# Patient Record
Sex: Female | Born: 1950 | Race: White | Hispanic: No
Health system: Southern US, Community
[De-identification: ages and names within clinical notes are randomized; demographics above are authoritative.]

## PROBLEM LIST (undated history)

## (undated) DIAGNOSIS — J9621 Acute and chronic respiratory failure with hypoxia: Secondary | ICD-10-CM

## (undated) DIAGNOSIS — U071 COVID-19: Secondary | ICD-10-CM

## (undated) DIAGNOSIS — G6281 Critical illness polyneuropathy: Secondary | ICD-10-CM

## (undated) DIAGNOSIS — J189 Pneumonia, unspecified organism: Secondary | ICD-10-CM

## (undated) DIAGNOSIS — G7000 Myasthenia gravis without (acute) exacerbation: Secondary | ICD-10-CM

## (undated) DIAGNOSIS — J1282 Pneumonia due to coronavirus disease 2019: Secondary | ICD-10-CM

---

## 2019-12-10 ENCOUNTER — Inpatient Hospital Stay: Admission: RE | Admit: 2019-12-10 | Discharge: 2020-01-17 | Payer: Medicare Other | Source: Ambulatory Visit

## 2019-12-10 ENCOUNTER — Other Ambulatory Visit (HOSPITAL_COMMUNITY): Payer: Medicare Other

## 2019-12-10 DIAGNOSIS — Z9911 Dependence on respirator [ventilator] status: Secondary | ICD-10-CM

## 2019-12-10 DIAGNOSIS — R0902 Hypoxemia: Secondary | ICD-10-CM

## 2019-12-10 DIAGNOSIS — G6281 Critical illness polyneuropathy: Secondary | ICD-10-CM | POA: Diagnosis present

## 2019-12-10 DIAGNOSIS — J9621 Acute and chronic respiratory failure with hypoxia: Secondary | ICD-10-CM | POA: Diagnosis present

## 2019-12-10 DIAGNOSIS — J1282 Pneumonia due to coronavirus disease 2019: Secondary | ICD-10-CM | POA: Diagnosis present

## 2019-12-10 DIAGNOSIS — U071 COVID-19: Secondary | ICD-10-CM | POA: Diagnosis present

## 2019-12-10 DIAGNOSIS — G7000 Myasthenia gravis without (acute) exacerbation: Secondary | ICD-10-CM | POA: Diagnosis present

## 2019-12-10 DIAGNOSIS — R0603 Acute respiratory distress: Secondary | ICD-10-CM

## 2019-12-10 DIAGNOSIS — J189 Pneumonia, unspecified organism: Secondary | ICD-10-CM | POA: Diagnosis present

## 2019-12-10 DIAGNOSIS — K567 Ileus, unspecified: Secondary | ICD-10-CM

## 2019-12-10 DIAGNOSIS — J9690 Respiratory failure, unspecified, unspecified whether with hypoxia or hypercapnia: Secondary | ICD-10-CM

## 2019-12-10 DIAGNOSIS — Z431 Encounter for attention to gastrostomy: Secondary | ICD-10-CM

## 2019-12-10 HISTORY — DX: Critical illness polyneuropathy: G62.81

## 2019-12-10 HISTORY — DX: Pneumonia, unspecified organism: J18.9

## 2019-12-10 HISTORY — DX: COVID-19: U07.1

## 2019-12-10 HISTORY — DX: Myasthenia gravis without (acute) exacerbation: G70.00

## 2019-12-10 HISTORY — DX: Pneumonia due to coronavirus disease 2019: J12.82

## 2019-12-10 HISTORY — DX: Acute and chronic respiratory failure with hypoxia: J96.21

## 2019-12-10 LAB — BLOOD GAS, ARTERIAL
Acid-Base Excess: 11.9 mmol/L — ABNORMAL HIGH (ref 0.0–2.0)
Bicarbonate: 36.7 mmol/L — ABNORMAL HIGH (ref 20.0–28.0)
FIO2: 40
O2 Saturation: 97.2 %
Patient temperature: 37
pCO2 arterial: 55.5 mmHg — ABNORMAL HIGH (ref 32.0–48.0)
pH, Arterial: 7.435 (ref 7.350–7.450)
pO2, Arterial: 87 mmHg (ref 83.0–108.0)

## 2019-12-11 ENCOUNTER — Other Ambulatory Visit (HOSPITAL_COMMUNITY): Payer: Medicare Other

## 2019-12-11 DIAGNOSIS — G6281 Critical illness polyneuropathy: Secondary | ICD-10-CM | POA: Diagnosis present

## 2019-12-11 DIAGNOSIS — U071 COVID-19: Secondary | ICD-10-CM | POA: Diagnosis present

## 2019-12-11 DIAGNOSIS — J189 Pneumonia, unspecified organism: Secondary | ICD-10-CM

## 2019-12-11 DIAGNOSIS — G7000 Myasthenia gravis without (acute) exacerbation: Secondary | ICD-10-CM | POA: Diagnosis present

## 2019-12-11 DIAGNOSIS — J9621 Acute and chronic respiratory failure with hypoxia: Secondary | ICD-10-CM | POA: Diagnosis not present

## 2019-12-11 DIAGNOSIS — J1282 Pneumonia due to coronavirus disease 2019: Secondary | ICD-10-CM

## 2019-12-11 LAB — COMPREHENSIVE METABOLIC PANEL
ALT: 16 U/L (ref 0–44)
AST: 20 U/L (ref 15–41)
Albumin: 2.6 g/dL — ABNORMAL LOW (ref 3.5–5.0)
Alkaline Phosphatase: 97 U/L (ref 38–126)
Anion gap: 10 (ref 5–15)
BUN: 15 mg/dL (ref 8–23)
CO2: 36 mmol/L — ABNORMAL HIGH (ref 22–32)
Calcium: 9.2 mg/dL (ref 8.9–10.3)
Chloride: 91 mmol/L — ABNORMAL LOW (ref 98–111)
Creatinine, Ser: 0.49 mg/dL (ref 0.44–1.00)
GFR calc Af Amer: >60 mL/min (ref >60)
GFR calc non Af Amer: >60 mL/min (ref >60)
Glucose, Bld: 163 mg/dL — ABNORMAL HIGH (ref 70–99)
Potassium: 4.1 mmol/L (ref 3.5–5.1)
Sodium: 137 mmol/L (ref 135–145)
Total Bilirubin: 0.6 mg/dL (ref 0.3–1.2)
Total Protein: 6.2 g/dL — ABNORMAL LOW (ref 6.5–8.1)

## 2019-12-11 LAB — CBC WITH DIFFERENTIAL/PLATELET
Abs Immature Granulocytes: 0.07 10*3/uL (ref 0.00–0.07)
Basophils Absolute: 0 10*3/uL (ref 0.0–0.1)
Basophils Relative: 0 %
Eosinophils Absolute: 0.1 10*3/uL (ref 0.0–0.5)
Eosinophils Relative: 3 %
HCT: 27.7 % — ABNORMAL LOW (ref 36.0–46.0)
Hemoglobin: 8.2 g/dL — ABNORMAL LOW (ref 12.0–15.0)
Immature Granulocytes: 1 %
Lymphocytes Relative: 23 %
Lymphs Abs: 1.3 10*3/uL (ref 0.7–4.0)
MCH: 27.2 pg (ref 26.0–34.0)
MCHC: 29.6 g/dL — ABNORMAL LOW (ref 30.0–36.0)
MCV: 91.7 fL (ref 80.0–100.0)
Monocytes Absolute: 0.4 10*3/uL (ref 0.1–1.0)
Monocytes Relative: 8 %
Neutro Abs: 3.6 10*3/uL (ref 1.7–7.7)
Neutrophils Relative %: 65 %
Platelets: 250 10*3/uL (ref 150–400)
RBC: 3.02 MIL/uL — ABNORMAL LOW (ref 3.87–5.11)
RDW: 17.4 % — ABNORMAL HIGH (ref 11.5–15.5)
WBC: 5.5 10*3/uL (ref 4.0–10.5)
nRBC: 0 % (ref 0.0–0.2)

## 2019-12-11 LAB — PROTIME-INR
INR: 1.1 (ref 0.8–1.2)
Prothrombin Time: 13.6 seconds (ref 11.4–15.2)

## 2019-12-11 LAB — T4, FREE: Free T4: 1.15 ng/dL — ABNORMAL HIGH (ref 0.61–1.12)

## 2019-12-11 LAB — HEMOGLOBIN A1C
Hgb A1c MFr Bld: 6.4 % — ABNORMAL HIGH (ref 4.8–5.6)
Mean Plasma Glucose: 136.98 mg/dL

## 2019-12-11 LAB — PHOSPHORUS: Phosphorus: 3.5 mg/dL (ref 2.5–4.6)

## 2019-12-11 LAB — MAGNESIUM: Magnesium: 2 mg/dL (ref 1.7–2.4)

## 2019-12-11 LAB — TSH: TSH: 4.042 u[IU]/mL (ref 0.350–4.500)

## 2019-12-11 MED ORDER — GENERIC EXTERNAL MEDICATION
Status: DC
Start: ? — End: 2019-12-11

## 2019-12-11 MED ORDER — ASPIRIN 81 MG PO CHEW
81.00 | CHEWABLE_TABLET | ORAL | Status: DC
Start: 2019-12-11 — End: 2019-12-11

## 2019-12-11 MED ORDER — FENTANYL CITRATE (PF) 2500 MCG/50ML IJ SOLN
25.00 | INTRAMUSCULAR | Status: DC
Start: ? — End: 2019-12-11

## 2019-12-11 MED ORDER — ACETAMINOPHEN 650 MG RE SUPP
650.00 | RECTAL | Status: DC
Start: ? — End: 2019-12-11

## 2019-12-11 MED ORDER — ALBUTEROL SULFATE HFA 108 (90 BASE) MCG/ACT IN AERS
2.00 | INHALATION_SPRAY | RESPIRATORY_TRACT | Status: DC
Start: ? — End: 2019-12-11

## 2019-12-11 MED ORDER — INSULIN LISPRO 100 UNIT/ML ~~LOC~~ SOLN
1.00 | SUBCUTANEOUS | Status: DC
Start: 2019-12-10 — End: 2019-12-11

## 2019-12-11 MED ORDER — AMLODIPINE BESYLATE 10 MG PO TABS
10.00 | ORAL_TABLET | ORAL | Status: DC
Start: 2019-12-11 — End: 2019-12-11

## 2019-12-11 MED ORDER — HYDRALAZINE HCL 10 MG PO TABS
10.00 | ORAL_TABLET | ORAL | Status: DC
Start: 2019-12-10 — End: 2019-12-11

## 2019-12-11 MED ORDER — MIDAZOLAM HCL 2 MG/2ML IJ SOLN
2.00 | INTRAMUSCULAR | Status: DC
Start: ? — End: 2019-12-11

## 2019-12-11 MED ORDER — INSULIN LISPRO 100 UNIT/ML ~~LOC~~ SOLN
1.00 | SUBCUTANEOUS | Status: DC
Start: ? — End: 2019-12-11

## 2019-12-11 MED ORDER — CLONIDINE HCL 0.2 MG PO TABS
0.20 | ORAL_TABLET | ORAL | Status: DC
Start: 2019-12-10 — End: 2019-12-11

## 2019-12-11 MED ORDER — GENERIC EXTERNAL MEDICATION
300.00 | Status: DC
Start: 2019-12-10 — End: 2019-12-11

## 2019-12-11 MED ORDER — CLOTRIMAZOLE-BETAMETHASONE 1-0.05 % EX CREA
TOPICAL_CREAM | CUTANEOUS | Status: DC
Start: 2019-12-10 — End: 2019-12-11

## 2019-12-11 MED ORDER — INSULIN GLARGINE 100 UNIT/ML ~~LOC~~ SOLN
1.00 | SUBCUTANEOUS | Status: DC
Start: 2019-12-10 — End: 2019-12-11

## 2019-12-11 MED ORDER — HEPARIN SODIUM (PORCINE) 5000 UNIT/ML IJ SOLN
5000.00 | INTRAMUSCULAR | Status: DC
Start: 2019-12-10 — End: 2019-12-11

## 2019-12-11 MED ORDER — DIPHENOXYLATE-ATROPINE 2.5-0.025 MG/5ML PO LIQD
5.00 | ORAL | Status: DC
Start: ? — End: 2019-12-11

## 2019-12-11 MED ORDER — FUROSEMIDE 10 MG/ML IJ SOLN
40.00 | INTRAMUSCULAR | Status: DC
Start: 2019-12-11 — End: 2019-12-11

## 2019-12-11 MED ORDER — PYRIDOSTIGMINE BROMIDE 60 MG/5ML PO SOLN
60.00 | ORAL | Status: DC
Start: 2019-12-10 — End: 2019-12-11

## 2019-12-11 MED ORDER — HYDRALAZINE HCL 20 MG/ML IJ SOLN
10.00 | INTRAMUSCULAR | Status: DC
Start: ? — End: 2019-12-11

## 2019-12-11 MED ORDER — INSULIN GLARGINE 100 UNIT/ML ~~LOC~~ SOLN
1.00 | SUBCUTANEOUS | Status: DC
Start: ? — End: 2019-12-11

## 2019-12-11 MED ORDER — ACETAMINOPHEN 160 MG/5ML PO SUSP
650.00 | ORAL | Status: DC
Start: ? — End: 2019-12-11

## 2019-12-11 MED ORDER — LOPERAMIDE HCL 1 MG/7.5ML PO LIQD
2.00 | ORAL | Status: DC
Start: ? — End: 2019-12-11

## 2019-12-11 MED ORDER — SODIUM CHLORIDE 0.9 % IV SOLN
10.00 | INTRAVENOUS | Status: DC
Start: ? — End: 2019-12-11

## 2019-12-11 MED ORDER — PREDNISONE 20 MG PO TABS
20.00 | ORAL_TABLET | ORAL | Status: DC
Start: 2019-12-11 — End: 2019-12-11

## 2019-12-11 NOTE — Consult Note (Signed)
Pulmonary [place]  PULMONARY SERVICE  Date of Service: 12/11/2019  PULMONARY CRITICAL CARE CONSULT   JORDI KAMM  GLO:[id number]  DOB: [birth year]   DOA: 12/10/2019  Referring Physician: Merton Border, MD  HPI: LUEVENIA MCAVOY is a 69 y.o. female seen for follow up of Acute on Chronic Respiratory Failure.  Patient has multiple medical problems including CHF COPD coronary artery disease anxiety depression diabetes mellitus hypertension myasthenia gravis pulmonary hypertension presented to the hospital because of increasing shortness of breath.  She at the time of admission was noted to have acute renal failure with a prior history of short-term dialysis and she tested positive for COVID-19 back in November 2020 the patient initially had mild symptoms however this worsened and she ended up having to be admitted was started on IV Decadron remdesivir and had decompensation ended up intubated on the ventilator.  Subsequently she had a great deal of difficulty weaning she does have a history of myasthenia gravis underlying with critical illness polyneuropathy myopathy which likely complicated her ability to wean off the ventilator.  Review of Systems:  ROS performed and is unremarkable other than noted above.  Past Medical History:  Diagnosis Date  . Acute tubular necrosis (*)  . Breathing difficulty  . CHF (congestive heart failure) (*)  . COPD (chronic obstructive pulmonary disease) (*)  . Coronary artery disease  . Depression  . Diabetes mellitus (*)  . Hypertension  . Myasthenia gravis (*)  . Pulmonary hypertension (*)   Past Surgical History:  Procedure Laterality Date  . Hysterectomy   Family History  Problem Relation Age of Onset  . Myasthenia gravis Unknown   Social History   Socioeconomic History  . Marital status: Widowed  Spouse name: Not on file  . Number of children: Not on file  . Years of education: Not on file  .  Highest education level: Not on file  Occupational History  . Occupation: Science writer: [employer]  Tobacco Use  . Smoking status: Former Research scientist (life sciences)  . Smokeless tobacco: Never Used    Medications: Reviewed on Rounds  Physical Exam:  Vitals: Temperature 97.1 pulse 71 respiratory 20 blood pressure is 161/71 saturations 99%  Ventilator Settings mode ventilation is pressure assist control respiratory pressure 15 tidal volume 354 PEEP 5  . General: Comfortable at this time . Eyes: Grossly normal lids, irises & conjunctiva . ENT: grossly tongue is normal . Neck: no obvious mass . Cardiovascular: S1-S2 normal no gallop or rub . Respiratory: No rhonchi no rales are noted at this time . Abdomen: Soft and nontender . Skin: no rash seen on limited exam . Musculoskeletal: not rigid . Psychiatric:unable to assess . Neurologic: no seizure no involuntary movements         Labs on Admission:  Basic Metabolic Panel: Recent Labs  Lab 12/11/19 0618  NA 137  K 4.1  CL 91*  CO2 36*  GLUCOSE 163*  BUN 15  CREATININE 0.49  CALCIUM 9.2  MG 2.0  PHOS 3.5    Recent Labs  Lab 12/10/19 1600  PHART 7.435  PCO2ART 55.5*  PO2ART 87.0  HCO3 36.7*  O2SAT 97.2    Liver Function Tests: Recent Labs  Lab 12/11/19 0618  AST 20  ALT 16  ALKPHOS 97  BILITOT 0.6  PROT 6.2*  ALBUMIN 2.6*   No results for input(s): LIPASE, AMYLASE in the last 168 hours. No results for input(s): AMMONIA in the last 168  hours.  CBC: Recent Labs  Lab 12/11/19 0618  WBC 5.5  NEUTROABS 3.6  HGB 8.2*  HCT 27.7*  MCV 91.7  PLT 250    Cardiac Enzymes: No results for input(s): CKTOTAL, CKMB, CKMBINDEX, TROPONINI in the last 168 hours.  BNP (last 3 results) No results for input(s): BNP in the last 8760 hours.  ProBNP (last 3 results) No results for input(s): PROBNP in the last 8760 hours.   Radiological Exams on Admission: DG Abd 1 View  Result Date: 12/10/2019 CLINICAL DATA:   69 year old female under evaluation for percutaneous gastrostomy tube placement. EXAM: ABDOMEN - 1 VIEW COMPARISON:  No priors. FINDINGS: Single view of the abdomen after injection of percutaneous gastrostomy tube demonstrates contrast in the lumen of the stomach. Nonobstructive bowel gas pattern. IMPRESSION: 1. Tip of percutaneous gastrostomy tube appears to be within the lumen of the stomach. Electronically Signed   By: Trudie Reed M.D.   On: 12/10/2019 16:01   DG CHEST PORT 1 VIEW  Result Date: 12/11/2019 CLINICAL DATA:  Encounter for Respiratory failure and pneumonia. EXAM: PORTABLE CHEST 1 VIEW COMPARISON:  None. FINDINGS: Tracheostomy tube good position. Low lung volumes. There is patchy bilateral nodular airspace densities. RIGHT PICC line tip in the distal SVC. IMPRESSION: Patchy bilateral airspace densities concerning for multifocal pneumonia or edema. Low lung volumes. Electronically Signed   By: Genevive Bi M.D.   On: 12/11/2019 08:37    Assessment/Plan Active Problems:   Acute on chronic respiratory failure with hypoxia (HCC)   COVID-19 virus infection   Myasthenia gravis (HCC)   Multifocal pneumonia   Pneumonia due to COVID-19 virus   Critical illness polyneuropathy (HCC)   1. Acute on chronic respiratory failure with hypoxia right now she is on full support a great deal of problem that was in the face probably with her is her also her anxiety issues.  She admits that she has a lot of issues with anxiety and she is going to need optimization of her anxiety medications. 2. COVID-19 virus infection now in recovery phase we will continue with supportive care. 3. Myasthenia gravis patient is at baseline we will need to continue to monitor her status closely. 4. Community-acquired pneumonia left lower lobe treated at the other facility current chest x-ray reveals patchy airspace disease multifocal now likely related to the COVID-19 infection. 5. Critical illness polyneuropathy  supportive care physical therapy as necessary  I have personally seen and evaluated the patient, evaluated laboratory and imaging results, formulated the assessment and plan and placed orders. The Patient requires high complexity decision making with multiple systems involvement.  Case was discussed on Rounds with the Respiratory Therapy Director and the Respiratory staff Time Spent  Yevonne Pax, MD [facility] Pulmonary Critical Care Medicine Sleep Medicine

## 2019-12-12 DIAGNOSIS — J9621 Acute and chronic respiratory failure with hypoxia: Secondary | ICD-10-CM | POA: Diagnosis not present

## 2019-12-12 DIAGNOSIS — U071 COVID-19: Secondary | ICD-10-CM | POA: Diagnosis not present

## 2019-12-12 DIAGNOSIS — G6281 Critical illness polyneuropathy: Secondary | ICD-10-CM | POA: Diagnosis not present

## 2019-12-12 DIAGNOSIS — J189 Pneumonia, unspecified organism: Secondary | ICD-10-CM | POA: Diagnosis not present

## 2019-12-12 LAB — BASIC METABOLIC PANEL
Anion gap: 10 (ref 5–15)
BUN: 20 mg/dL (ref 8–23)
CO2: 37 mmol/L — ABNORMAL HIGH (ref 22–32)
Calcium: 9.5 mg/dL (ref 8.9–10.3)
Chloride: 88 mmol/L — ABNORMAL LOW (ref 98–111)
Creatinine, Ser: 0.61 mg/dL (ref 0.44–1.00)
GFR calc Af Amer: >60 mL/min (ref >60)
GFR calc non Af Amer: >60 mL/min (ref >60)
Glucose, Bld: 208 mg/dL — ABNORMAL HIGH (ref 70–99)
Potassium: 4 mmol/L (ref 3.5–5.1)
Sodium: 135 mmol/L (ref 135–145)

## 2019-12-12 NOTE — Progress Notes (Addendum)
Pulmonary Critical Care Medicine [place]   PULMONARY CRITICAL CARE SERVICE  PROGRESS NOTE  Date of Service: 12/12/2019  Kelly Dawson  EXB:[id number]  DOB: Oct 03, 1951   DOA: 12/10/2019  Referring Physician: Merton Border, MD  HPI: Kelly Dawson is a 69 y.o. female seen for follow up of Acute on Chronic Respiratory Failure.  Patient currently is on pressure support mode weaning right now is on 35% FiO2  Medications: Reviewed on Rounds  Physical Exam:  Vitals: Temperature 97.3 pulse 58 respiratory 15 blood pressure 120/65 saturations 100%  Ventilator Settings mode ventilation pressure support FiO2 35% pressure poor 12 PEEP 5  . General: Comfortable at this time . Eyes: Grossly normal lids, irises & conjunctiva . ENT: grossly tongue is normal . Neck: no obvious mass . Cardiovascular: S1 S2 normal no gallop . Respiratory: No rhonchi no rales are noted at this time . Abdomen: soft . Skin: no rash seen on limited exam . Musculoskeletal: not rigid . Psychiatric:unable to assess . Neurologic: no seizure no involuntary movements         Lab Data:   Basic Metabolic Panel: Recent Labs  Lab 12/11/19 0618 12/12/19 1015  NA 137 135  K 4.1 4.0  CL 91* 88*  CO2 36* 37*  GLUCOSE 163* 208*  BUN 15 20  CREATININE 0.49 0.61  CALCIUM 9.2 9.5  MG 2.0  --   PHOS 3.5  --     ABG: Recent Labs  Lab 12/10/19 1600  PHART 7.435  PCO2ART 55.5*  PO2ART 87.0  HCO3 36.7*  O2SAT 97.2    Liver Function Tests: Recent Labs  Lab 12/11/19 0618  AST 20  ALT 16  ALKPHOS 97  BILITOT 0.6  PROT 6.2*  ALBUMIN 2.6*   No results for input(s): LIPASE, AMYLASE in the last 168 hours. No results for input(s): AMMONIA in the last 168 hours.  CBC: Recent Labs  Lab 12/11/19 0618  WBC 5.5  NEUTROABS 3.6  HGB 8.2*  HCT 27.7*  MCV 91.7  PLT 250    Cardiac Enzymes: No results for input(s): CKTOTAL, CKMB, CKMBINDEX, TROPONINI in the last 168 hours.  BNP  (last 3 results) No results for input(s): BNP in the last 8760 hours.  ProBNP (last 3 results) No results for input(s): PROBNP in the last 8760 hours.  Radiological Exams: DG Abd 1 View  Result Date: 12/10/2019 CLINICAL DATA:  69 year old female under evaluation for percutaneous gastrostomy tube placement. EXAM: ABDOMEN - 1 VIEW COMPARISON:  No priors. FINDINGS: Single view of the abdomen after injection of percutaneous gastrostomy tube demonstrates contrast in the lumen of the stomach. Nonobstructive bowel gas pattern. IMPRESSION: 1. Tip of percutaneous gastrostomy tube appears to be within the lumen of the stomach. Electronically Signed   By: Vinnie Langton M.D.   On: 12/10/2019 16:01   DG CHEST PORT 1 VIEW  Result Date: 12/11/2019 CLINICAL DATA:  Encounter for Respiratory failure and pneumonia. EXAM: PORTABLE CHEST 1 VIEW COMPARISON:  None. FINDINGS: Tracheostomy tube good position. Low lung volumes. There is patchy bilateral nodular airspace densities. RIGHT PICC line tip in the distal SVC. IMPRESSION: Patchy bilateral airspace densities concerning for multifocal pneumonia or edema. Low lung volumes. Electronically Signed   By: Suzy Bouchard M.D.   On: 12/11/2019 08:37    Assessment/Plan Active Problems:   Acute on chronic respiratory failure with hypoxia (HCC)   COVID-19 virus infection   Myasthenia gravis ([place])   Multifocal pneumonia   Pneumonia due  to COVID-19 virus   Critical illness polyneuropathy (HCC)   1. Acute on chronic respiratory failure with hypoxia plan is to continue with the weaning protocol 12/5 2. COVID-19 virus infection treated we will continue present management 3. Myasthenia gravis no change therapy as tolerated 4. Multifocal pneumonia last chest x-ray still showing significant infiltrate 5. COVID-19 pneumonia as above still with significant infiltrates 6. Critical illness polymyopathy supportive care physical therapy   I have personally seen and  evaluated the patient, evaluated laboratory and imaging results, formulated the assessment and plan and placed orders. The Patient requires high complexity decision making with multiple systems involvement.  Rounds were done with the Respiratory Therapy Director and Staff therapists and discussed with nursing staff also.  Yevonne Pax, MD [facility] Pulmonary Critical Care Medicine Sleep Medicine

## 2019-12-13 ENCOUNTER — Other Ambulatory Visit (HOSPITAL_COMMUNITY): Payer: Medicare Other

## 2019-12-13 DIAGNOSIS — R0603 Acute respiratory distress: Secondary | ICD-10-CM | POA: Diagnosis not present

## 2019-12-13 DIAGNOSIS — U071 COVID-19: Secondary | ICD-10-CM | POA: Diagnosis not present

## 2019-12-13 DIAGNOSIS — G6281 Critical illness polyneuropathy: Secondary | ICD-10-CM | POA: Diagnosis not present

## 2019-12-13 DIAGNOSIS — J9621 Acute and chronic respiratory failure with hypoxia: Secondary | ICD-10-CM | POA: Diagnosis not present

## 2019-12-13 LAB — BLOOD GAS, ARTERIAL
Acid-Base Excess: 16.5 mmol/L — ABNORMAL HIGH (ref 0.0–2.0)
Bicarbonate: 42.3 mmol/L — ABNORMAL HIGH (ref 20.0–28.0)
FIO2: 30
O2 Saturation: 96 %
Patient temperature: 37
pCO2 arterial: 69.5 mmHg (ref 32.0–48.0)
pH, Arterial: 7.401 (ref 7.350–7.450)
pO2, Arterial: 77.8 mmHg — ABNORMAL LOW (ref 83.0–108.0)

## 2019-12-13 LAB — BASIC METABOLIC PANEL
Anion gap: 11 (ref 5–15)
BUN: 26 mg/dL — ABNORMAL HIGH (ref 8–23)
CO2: 40 mmol/L — ABNORMAL HIGH (ref 22–32)
Calcium: 9.3 mg/dL (ref 8.9–10.3)
Chloride: 87 mmol/L — ABNORMAL LOW (ref 98–111)
Creatinine, Ser: 0.61 mg/dL (ref 0.44–1.00)
GFR calc Af Amer: >60 mL/min (ref >60)
GFR calc non Af Amer: >60 mL/min (ref >60)
Glucose, Bld: 195 mg/dL — ABNORMAL HIGH (ref 70–99)
Potassium: 4.3 mmol/L (ref 3.5–5.1)
Sodium: 138 mmol/L (ref 135–145)

## 2019-12-13 LAB — CBC
HCT: 30.5 % — ABNORMAL LOW (ref 36.0–46.0)
Hemoglobin: 9.1 g/dL — ABNORMAL LOW (ref 12.0–15.0)
MCH: 27 pg (ref 26.0–34.0)
MCHC: 29.8 g/dL — ABNORMAL LOW (ref 30.0–36.0)
MCV: 90.5 fL (ref 80.0–100.0)
Platelets: 232 10*3/uL (ref 150–400)
RBC: 3.37 MIL/uL — ABNORMAL LOW (ref 3.87–5.11)
RDW: 17 % — ABNORMAL HIGH (ref 11.5–15.5)
WBC: 7.5 10*3/uL (ref 4.0–10.5)
nRBC: 0 % (ref 0.0–0.2)

## 2019-12-13 LAB — PHOSPHORUS: Phosphorus: 4 mg/dL (ref 2.5–4.6)

## 2019-12-13 LAB — MAGNESIUM: Magnesium: 2 mg/dL (ref 1.7–2.4)

## 2019-12-13 LAB — TROPONIN I (HIGH SENSITIVITY): Troponin I (High Sensitivity): 7 ng/L (ref <18)

## 2019-12-13 MED ORDER — GENERIC EXTERNAL MEDICATION
Status: DC
Start: ? — End: 2019-12-13

## 2019-12-13 NOTE — Progress Notes (Signed)
Pulmonary Critical Care Medicine [facility] [place]   PULMONARY CRITICAL CARE SERVICE  PROGRESS NOTE  Date of Service: 12/13/2019  Kelly Dawson  JSH:[id number]  DOB: 06-09-1951   DOA: 12/10/2019  Referring Physician: Carron Curie, MD  HPI: Kelly Dawson is a 69 y.o. female seen for follow up of Acute on Chronic Respiratory Failure.  Patient is on full support on pressure control mode has been on 30% FiO2 right now apparently this morning she had a rapid response  Medications: Reviewed on Rounds  Physical Exam:  Vitals: Temperature 97.7 pulse 77 respiratory 20 blood pressures 107/74 saturations 94%  Ventilator Settings mode ventilation pressure assist control FiO2 30% tidal line 338 PEEP 5  . General: Comfortable at this time . Eyes: Grossly normal lids, irises & conjunctiva . ENT: grossly tongue is normal . Neck: no obvious mass . Cardiovascular: S1 S2 normal no gallop . Respiratory: No rhonchi no rales are noted at this time . Abdomen: soft . Skin: no rash seen on limited exam . Musculoskeletal: not rigid . Psychiatric:unable to assess . Neurologic: no seizure no involuntary movements         Lab Data:   Basic Metabolic Panel: Recent Labs  Lab 12/11/19 0618 12/12/19 1015 12/13/19 0924  NA 137 135 138  K 4.1 4.0 4.3  CL 91* 88* 87*  CO2 36* 37* 40*  GLUCOSE 163* 208* 195*  BUN 15 20 26*  CREATININE 0.49 0.61 0.61  CALCIUM 9.2 9.5 9.3  MG 2.0  --  2.0  PHOS 3.5  --  4.0    ABG: Recent Labs  Lab 12/10/19 1600 12/13/19 1214  PHART 7.435 7.401  PCO2ART 55.5* 69.5*  PO2ART 87.0 77.8*  HCO3 36.7* 42.3*  O2SAT 97.2 96.0    Liver Function Tests: Recent Labs  Lab 12/11/19 0618  AST 20  ALT 16  ALKPHOS 97  BILITOT 0.6  PROT 6.2*  ALBUMIN 2.6*   No results for input(s): LIPASE, AMYLASE in the last 168 hours. No results for input(s): AMMONIA in the last 168 hours.  CBC: Recent Labs  Lab 12/11/19 0618 12/13/19 0924  WBC 5.5 7.5   NEUTROABS 3.6  --   HGB 8.2* 9.1*  HCT 27.7* 30.5*  MCV 91.7 90.5  PLT 250 232    Cardiac Enzymes: No results for input(s): CKTOTAL, CKMB, CKMBINDEX, TROPONINI in the last 168 hours.  BNP (last 3 results) No results for input(s): BNP in the last 8760 hours.  ProBNP (last 3 results) No results for input(s): PROBNP in the last 8760 hours.  Radiological Exams: DG CHEST PORT 1 VIEW  Result Date: 12/13/2019 CLINICAL DATA:  Acute respiratory distress. COVID-19 viral pneumonia. On ventilator. EXAM: PORTABLE CHEST 1 VIEW COMPARISON:  12/11/2019 FINDINGS: Tracheostomy tube remains in appropriate position. Heart size and low lung volumes are stable. Diffuse interstitial infiltrates show no significant change. No evidence of focal consolidation or pleural effusion. Aortic atherosclerosis incidentally noted. IMPRESSION: Stable low lung volumes and diffuse interstitial infiltrates. Electronically Signed   By: Danae Orleans M.D.   On: 12/13/2019 08:44    Assessment/Plan Active Problems:   Acute on chronic respiratory failure with hypoxia (HCC)   COVID-19 virus infection   Myasthenia gravis (HCC)   Multifocal pneumonia   Pneumonia due to COVID-19 virus   Critical illness polyneuropathy (HCC)   1. Acute on chronic respiratory failure with hypoxia patient right now is on full support on pressure control mode has been on 30% FiO2 with a  PEEP of 5 we will continue with full support get a follow-up ABG after the rapid response and adjust the ventilator settings accordingly.  Suspected the patient may have increased PCO2 now as she is a little bit more somnolent which is seen on the ABG above. 2. COVID-19 virus infection we will continue with supportive care 3. Myasthenia gravis continue with present management 4. Multifocal pneumonia treated 5. Pneumonia due to COVID-19 treated we will continue with supportive care 6. Critical illness polyneuropathy physical therapy as tolerated   I have  personally seen and evaluated the patient, evaluated laboratory and imaging results, formulated the assessment and plan and placed orders. The Patient requires high complexity decision making with multiple systems involvement.  Time 35 minutes Rounds were done with the Respiratory Therapy Director and Staff therapists and discussed with nursing staff also.  Allyne Gee, MD [facility] Pulmonary Critical Care Medicine Sleep Medicine

## 2019-12-14 DIAGNOSIS — J189 Pneumonia, unspecified organism: Secondary | ICD-10-CM | POA: Diagnosis not present

## 2019-12-14 DIAGNOSIS — U071 COVID-19: Secondary | ICD-10-CM | POA: Diagnosis not present

## 2019-12-14 DIAGNOSIS — J9621 Acute and chronic respiratory failure with hypoxia: Secondary | ICD-10-CM | POA: Diagnosis not present

## 2019-12-14 DIAGNOSIS — G6281 Critical illness polyneuropathy: Secondary | ICD-10-CM | POA: Diagnosis not present

## 2019-12-14 NOTE — Progress Notes (Signed)
Pulmonary Critical Care Medicine [facility] [place]   PULMONARY CRITICAL CARE SERVICE  PROGRESS NOTE  Date of Service: 12/14/2019  Kelly Dawson  FAO:[id number]  DOB: 1951/04/09   DOA: 12/10/2019  Referring Physician: Carron Curie, MD  HPI: Kelly Dawson is a 69 y.o. female seen for follow up of Acute on Chronic Respiratory Failure.  She still is having episodes of bradycardia so I have asked for respiratory therapy to hold off on making attempts at weaning  Medications: Reviewed on Rounds  Physical Exam:  Vitals: Temperature 97.7 pulse 60 respiratory rate 36 blood pressure is 152/75 saturations 100%  Ventilator Settings mode ventilation pressure assist control FiO2 30% inspiratory pressure 18 PEEP 5 tidal line 345  . General: Comfortable at this time . Eyes: Grossly normal lids, irises & conjunctiva . ENT: grossly tongue is normal . Neck: no obvious mass . Cardiovascular: S1 S2 normal no gallop . Respiratory: No rhonchi no rales are noted at this time . Abdomen: soft . Skin: no rash seen on limited exam . Musculoskeletal: not rigid . Psychiatric:unable to assess . Neurologic: no seizure no involuntary movements         Lab Data:   Basic Metabolic Panel: Recent Labs  Lab 12/11/19 0618 12/12/19 1015 12/13/19 0924  NA 137 135 138  K 4.1 4.0 4.3  CL 91* 88* 87*  CO2 36* 37* 40*  GLUCOSE 163* 208* 195*  BUN 15 20 26*  CREATININE 0.49 0.61 0.61  CALCIUM 9.2 9.5 9.3  MG 2.0  --  2.0  PHOS 3.5  --  4.0    ABG: Recent Labs  Lab 12/10/19 1600 12/13/19 1214  PHART 7.435 7.401  PCO2ART 55.5* 69.5*  PO2ART 87.0 77.8*  HCO3 36.7* 42.3*  O2SAT 97.2 96.0    Liver Function Tests: Recent Labs  Lab 12/11/19 0618  AST 20  ALT 16  ALKPHOS 97  BILITOT 0.6  PROT 6.2*  ALBUMIN 2.6*   No results for input(s): LIPASE, AMYLASE in the last 168 hours. No results for input(s): AMMONIA in the last 168 hours.  CBC: Recent Labs  Lab 12/11/19 0618  12/13/19 0924  WBC 5.5 7.5  NEUTROABS 3.6  --   HGB 8.2* 9.1*  HCT 27.7* 30.5*  MCV 91.7 90.5  PLT 250 232    Cardiac Enzymes: No results for input(s): CKTOTAL, CKMB, CKMBINDEX, TROPONINI in the last 168 hours.  BNP (last 3 results) No results for input(s): BNP in the last 8760 hours.  ProBNP (last 3 results) No results for input(s): PROBNP in the last 8760 hours.  Radiological Exams: DG CHEST PORT 1 VIEW  Result Date: 12/13/2019 CLINICAL DATA:  Acute respiratory distress. COVID-19 viral pneumonia. On ventilator. EXAM: PORTABLE CHEST 1 VIEW COMPARISON:  12/11/2019 FINDINGS: Tracheostomy tube remains in appropriate position. Heart size and low lung volumes are stable. Diffuse interstitial infiltrates show no significant change. No evidence of focal consolidation or pleural effusion. Aortic atherosclerosis incidentally noted. IMPRESSION: Stable low lung volumes and diffuse interstitial infiltrates. Electronically Signed   By: Danae Orleans M.D.   On: 12/13/2019 08:44    Assessment/Plan Active Problems:   Acute on chronic respiratory failure with hypoxia (HCC)   COVID-19 virus infection   Myasthenia gravis (HCC)   Multifocal pneumonia   Pneumonia due to COVID-19 virus   Critical illness polyneuropathy (HCC)   1. Acute on chronic respiratory failure hypoxia plan is to continue with full support on the vent for now hold off on weaning  until cardiology sees the patient 2. COVID-19 virus infection in resolution phase 3. Myasthenia gravis no change 4. Multifocal pneumonia treated 5. Pneumonia due to COVID-19 treated clinically improved 6. Critical illness polymyopathy we will continue with supportive care   I have personally seen and evaluated the patient, evaluated laboratory and imaging results, formulated the assessment and plan and placed orders. The Patient requires high complexity decision making with multiple systems involvement.  Rounds were done with the Respiratory  Therapy Director and Staff therapists and discussed with nursing staff also.  Allyne Gee, MD [facility] Pulmonary Critical Care Medicine Sleep Medicine

## 2019-12-14 NOTE — Consult Note (Signed)
Referring Physician: Manson Passey, MD  Kelly Dawson is an 69 y.o. female.                       Chief Complaint: Sinus tachycardia and respiratory failure  HPI: 69 years old white female with acute on chronic respiratory failure post COVD-19 pneumonia in Nov 2020 was treated with decadron, Remdesivir and intubation. She has episodes of sinus tachycardia. She has h/o COPD, CHF, CAD, type 2 DM, HTN and myesthenia gravis.   Past Medical History:  Diagnosis Date  . Acute on chronic respiratory failure with hypoxia (HCC)   . COVID-19 virus infection   . Critical illness polyneuropathy (HCC)   . Multifocal pneumonia   . Myasthenia gravis (HCC)   . Pneumonia due to COVID-19 virus       The histories are not reviewed yet. Please review them in the "History" navigator section and refresh this SmartLink.  No family history on file. Social History:  has history of smoking. No alcohol and drug use.  Allergies: Not on File  No medications prior to admission.    Results for orders placed or performed during the hospital encounter of 12/10/19 (from the past 48 hour(s))  CBC     Status: Abnormal   Collection Time: 12/13/19  9:24 AM  Result Value Ref Range   WBC 7.5 4.0 - 10.5 K/uL   RBC 3.37 (L) 3.87 - 5.11 MIL/uL   Hemoglobin 9.1 (L) 12.0 - 15.0 g/dL   HCT 30.1 (L) 60.1 - 09.3 %   MCV 90.5 80.0 - 100.0 fL   MCH 27.0 26.0 - 34.0 pg   MCHC 29.8 (L) 30.0 - 36.0 g/dL   RDW 23.5 (H) 57.3 - 22.0 %   Platelets 232 150 - 400 K/uL   nRBC 0.0 0.0 - 0.2 %    Comment: Performed at [facility], [address], [place], [place] [address]  Basic metabolic panel     Status: Abnormal   Collection Time: 12/13/19  9:24 AM  Result Value Ref Range   Sodium 138 135 - 145 mmol/L   Potassium 4.3 3.5 - 5.1 mmol/L   Chloride 87 (L) 98 - 111 mmol/L   CO2 40 (H) 22 - 32 mmol/L   Glucose, Bld 195 (H) 70 - 99 mg/dL   BUN 26 (H) 8 - 23 mg/dL   Creatinine, Ser 0.62 0.44 - 1.00 mg/dL   Calcium 9.3 8.9 - 37.6  mg/dL   GFR calc non Af Amer >60 >60 mL/min   GFR calc Af Amer >60 >60 mL/min   Anion gap 11 5 - 15    Comment: Performed at [facility], [address], [place], [place] [address]  Magnesium     Status: None   Collection Time: 12/13/19  9:24 AM  Result Value Ref Range   Magnesium 2.0 1.7 - 2.4 mg/dL    Comment: Performed at [facility], [address], [place], [place] [address]  Phosphorus     Status: None   Collection Time: 12/13/19  9:24 AM  Result Value Ref Range   Phosphorus 4.0 2.5 - 4.6 mg/dL    Comment: Performed at [facility], [address], [place], [place] [address]  Troponin I (High Sensitivity)     Status: None   Collection Time: 12/13/19  9:24 AM  Result Value Ref Range   Troponin I (High Sensitivity) 7 <18 ng/L    Comment: (  NOTE) Elevated high sensitivity troponin I (hsTnI) values and significant  changes across serial measurements may suggest ACS but many other  chronic and acute conditions are known to elevate hsTnI results.  Refer to the "Links" section for chest pain algorithms and additional  guidance. Performed at [facility], [place] [address] [place], [place] [address]   Blood gas, arterial     Status: Abnormal   Collection Time: 12/13/19 12:14 PM  Result Value Ref Range   FIO2 30.00    pH, Arterial 7.401 7.350 - 7.450   pCO2 arterial 69.5 (HH) 32.0 - 48.0 mmHg    Comment: CRITICAL RESULT CALLED TO, READ BACK BY AND VERIFIED WITH: KEDDIE,A RN @1228  ON [id number] BY FLEMINGS    pO2, Arterial 77.8 (L) 83.0 - 108.0 mmHg   Bicarbonate 42.3 (H) 20.0 - 28.0 mmol/L   Acid-Base Excess 16.5 (H) 0.0 - 2.0 mmol/L   O2 Saturation 96.0 %   Patient temperature 37.0    Collection site RIGHT RADIAL    Drawn by COLLECTED BY RT     Comment: MORGEN BIHL   Sample type ARTERIAL DRAW    Allens test (pass/fail) PASS PASS    Comment: Performed at [facility], [place] [address], [place], [place] [address]   DG CHEST PORT 1 VIEW  Result  Date: 12/13/2019 CLINICAL DATA:  Acute respiratory distress. COVID-19 viral pneumonia. On ventilator. EXAM: PORTABLE CHEST 1 VIEW COMPARISON:  12/11/2019 FINDINGS: Tracheostomy tube remains in appropriate position. Heart size and low lung volumes are stable. Diffuse interstitial infiltrates show no significant change. No evidence of focal consolidation or pleural effusion. Aortic atherosclerosis incidentally noted. IMPRESSION: Stable low lung volumes and diffuse interstitial infiltrates. Electronically Signed   By: Marlaine Hind M.D.   On: 12/13/2019 08:44    Review Of Systems Constitutional: No fever, chills, weight loss or gain. Eyes: No vision change, wears glasses. No discharge or pain. Ears: No hearing loss, No tinnitus. Respiratory: No asthma, COPD, pneumonias. Positive shortness of breath. No hemoptysis. Cardiovascular: Positive chest pain, palpitation, leg edema. Gastrointestinal: No nausea, vomiting, diarrhea, constipation. No GI bleed. No hepatitis. Genitourinary: No dysuria, hematuria, kidney stone. No incontinance. Neurological: No headache, stroke, seizures. Generalized weakness. Psychiatry: No psych facility admission for anxiety, depression, suicide. No detox. Skin: No rash. Musculoskeletal: Positive joint pain, fibromyalgia. No neck pain, back pain. Lymphadenopathy: No lymphadenopathy. Hematology: Positive anemia. No easy bruising.  T-97.7, P-70, R-30, BP 130/70. O2 sat is 98 %.  There were no vitals taken for this visit. There is no height or weight on file to calculate BMI. General appearance: alert, cooperative, appears stated age and no distress Head: Normocephalic, atraumatic. Eyes: Blue eyes, pale pink conjunctiva, corneas clear. PERRL, EOM's intact. Neck: No adenopathy, no carotid bruit, no JVD, supple, symmetrical, trachea midline and thyroid not enlarged. Resp: Clear to auscultation bilaterally. Cardio: Regular rate and rhythm, S1, S2 normal, II/VI systolic murmur, no  click, rub or gallop GI: Soft, non-tender; bowel sounds normal; no organomegaly. Extremities: No edema, cyanosis or clubbing. Skin: Warm and dry.  Neurologic: Alert and oriented X 0, decreased strength.   Assessment/Plan Acute on chronic respiratory failure with hypoxemia COVID-19 infection Myasthenia gravis Type 2 DM Polyneuropathy H/O CHF Tobacco use disorder Sinus tachycardia  Check echocardiogram for LV function. Continue Labetalol. Increase dose if needed.  Time spent: Review of old records, Lab, x-rays, EKG, other cardiac tests, examination, discussion with patient's doctor and nurse over 70 minutes.  Birdie Riddle, MD  12/14/2019, 3:35 PM

## 2019-12-15 ENCOUNTER — Other Ambulatory Visit (HOSPITAL_COMMUNITY): Payer: Medicare Other

## 2019-12-15 DIAGNOSIS — U071 COVID-19: Secondary | ICD-10-CM | POA: Diagnosis not present

## 2019-12-15 DIAGNOSIS — G6281 Critical illness polyneuropathy: Secondary | ICD-10-CM | POA: Diagnosis not present

## 2019-12-15 DIAGNOSIS — J9621 Acute and chronic respiratory failure with hypoxia: Secondary | ICD-10-CM | POA: Diagnosis not present

## 2019-12-15 DIAGNOSIS — J189 Pneumonia, unspecified organism: Secondary | ICD-10-CM | POA: Diagnosis not present

## 2019-12-15 LAB — CBC
HCT: 32.4 % — ABNORMAL LOW (ref 36.0–46.0)
Hemoglobin: 9.7 g/dL — ABNORMAL LOW (ref 12.0–15.0)
MCH: 26.9 pg (ref 26.0–34.0)
MCHC: 29.9 g/dL — ABNORMAL LOW (ref 30.0–36.0)
MCV: 90 fL (ref 80.0–100.0)
Platelets: 227 10*3/uL (ref 150–400)
RBC: 3.6 MIL/uL — ABNORMAL LOW (ref 3.87–5.11)
RDW: 17.2 % — ABNORMAL HIGH (ref 11.5–15.5)
WBC: 8.1 10*3/uL (ref 4.0–10.5)
nRBC: 0.2 % (ref 0.0–0.2)

## 2019-12-15 LAB — BASIC METABOLIC PANEL
Anion gap: 13 (ref 5–15)
BUN: 30 mg/dL — ABNORMAL HIGH (ref 8–23)
CO2: 39 mmol/L — ABNORMAL HIGH (ref 22–32)
Calcium: 9.7 mg/dL (ref 8.9–10.3)
Chloride: 87 mmol/L — ABNORMAL LOW (ref 98–111)
Creatinine, Ser: 0.68 mg/dL (ref 0.44–1.00)
GFR calc Af Amer: >60 mL/min (ref >60)
GFR calc non Af Amer: >60 mL/min (ref >60)
Glucose, Bld: 180 mg/dL — ABNORMAL HIGH (ref 70–99)
Potassium: 4.8 mmol/L (ref 3.5–5.1)
Sodium: 139 mmol/L (ref 135–145)

## 2019-12-15 LAB — PHOSPHORUS: Phosphorus: 3.4 mg/dL (ref 2.5–4.6)

## 2019-12-15 LAB — MAGNESIUM: Magnesium: 2.2 mg/dL (ref 1.7–2.4)

## 2019-12-15 MED ORDER — GENERIC EXTERNAL MEDICATION
Status: DC
Start: ? — End: 2019-12-15

## 2019-12-15 NOTE — Progress Notes (Signed)
Patient ID: Kelly Dawson, female   DOB: 1951-03-25, 69 y.o.   MRN: [id number]  2D Echocardiogram has been performed.  Clayton Lefort, RCS

## 2019-12-15 NOTE — Progress Notes (Signed)
Pulmonary Critical Care Medicine [place]   PULMONARY CRITICAL CARE SERVICE  PROGRESS NOTE  Date of Service: 12/15/2019  Kelly Dawson  ZDG:[id number]  DOB: Oct 19, 1951   DOA: 12/10/2019  Referring Physician: Merton Border, MD  HPI: Kelly Dawson is a 69 y.o. female seen for follow up of Acute on Chronic Respiratory Failure.  Patient is on full support currently on pressure control mode echocardiogram was done results are pending.  Cardiology also did see the patient so we are waiting for final input from them right now has not been tolerating any weaning.  Medications: Reviewed on Rounds  Physical Exam:  Vitals: Temperature 97.7 pulse 70 respiratory 25 blood pressure is 127/79 saturations 96%  Ventilator Settings mode of ventilation pressure assist control FiO2 is 28% tidal volume 329 PEEP of 5  . General: Comfortable at this time . Eyes: Grossly normal lids, irises & conjunctiva . ENT: grossly tongue is normal . Neck: no obvious mass . Cardiovascular: S1 S2 normal no gallop . Respiratory: No rhonchi no rales are noted at this time . Abdomen: soft . Skin: no rash seen on limited exam . Musculoskeletal: not rigid . Psychiatric:unable to assess . Neurologic: no seizure no involuntary movements         Lab Data:   Basic Metabolic Panel: Recent Labs  Lab 12/11/19 0618 12/12/19 1015 12/13/19 0924 12/15/19 0534  NA 137 135 138 139  K 4.1 4.0 4.3 4.8  CL 91* 88* 87* 87*  CO2 36* 37* 40* 39*  GLUCOSE 163* 208* 195* 180*  BUN 15 20 26* 30*  CREATININE 0.49 0.61 0.61 0.68  CALCIUM 9.2 9.5 9.3 9.7  MG 2.0  --  2.0 2.2  PHOS 3.5  --  4.0 3.4    ABG: Recent Labs  Lab 12/10/19 1600 12/13/19 1214  PHART 7.435 7.401  PCO2ART 55.5* 69.5*  PO2ART 87.0 77.8*  HCO3 36.7* 42.3*  O2SAT 97.2 96.0    Liver Function Tests: Recent Labs  Lab 12/11/19 0618  AST 20  ALT 16  ALKPHOS 97  BILITOT 0.6  PROT 6.2*  ALBUMIN 2.6*   No results for  input(s): LIPASE, AMYLASE in the last 168 hours. No results for input(s): AMMONIA in the last 168 hours.  CBC: Recent Labs  Lab 12/11/19 0618 12/13/19 0924 12/15/19 0534  WBC 5.5 7.5 8.1  NEUTROABS 3.6  --   --   HGB 8.2* 9.1* 9.7*  HCT 27.7* 30.5* 32.4*  MCV 91.7 90.5 90.0  PLT 250 232 227    Cardiac Enzymes: No results for input(s): CKTOTAL, CKMB, CKMBINDEX, TROPONINI in the last 168 hours.  BNP (last 3 results) No results for input(s): BNP in the last 8760 hours.  ProBNP (last 3 results) No results for input(s): PROBNP in the last 8760 hours.  Radiological Exams: DG CHEST PORT 1 VIEW  Result Date: 12/15/2019 CLINICAL DATA:  Ventilator dependent respiratory failure. EXAM: PORTABLE CHEST 1 VIEW COMPARISON:  One-view chest x-ray 12/15/2019 FINDINGS: Tracheostomy is stable. Lung volumes are low. Atherosclerotic changes are present the arch. Interstitial and airspace opacities are stable. Bilateral pleural effusions are stable. IMPRESSION: Stable appearance of bilateral interstitial and airspace disease and effusions. This likely reflects congestive heart failure. Electronically Signed   By: San Morelle M.D.   On: 12/15/2019 05:41    Assessment/Plan Active Problems:   Acute on chronic respiratory failure with hypoxia ([place])   COVID-19 virus infection   Myasthenia gravis ([place])   Multifocal pneumonia  Pneumonia due to COVID-19 virus   Critical illness polyneuropathy (HCC)   1. Acute on chronic respiratory failure hypoxia plan is to continue full support on the ventilator remains on pressure control mode hold off weaning until cardiology was able to evaluate the patient. 2. COVID-19 virus infection treated continue supportive care 3. Myasthenia gravis 4. Continue to follow 5. Multifocal pneumonia treated 6. COVID-19 at baseline we will continue with supportive care 7. Critical illness polyneuropathy no change from   I have personally seen and evaluated the  patient, evaluated laboratory and imaging results, formulated the assessment and plan and placed orders. The Patient requires high complexity decision making with multiple systems involvement.  Rounds were done with the Respiratory Therapy Director and Staff therapists and discussed with nursing staff also.  Yevonne Pax, MD [facility] Pulmonary Critical Care Medicine Sleep Medicine

## 2019-12-16 DIAGNOSIS — U071 COVID-19: Secondary | ICD-10-CM | POA: Diagnosis not present

## 2019-12-16 DIAGNOSIS — J9621 Acute and chronic respiratory failure with hypoxia: Secondary | ICD-10-CM | POA: Diagnosis not present

## 2019-12-16 DIAGNOSIS — G6281 Critical illness polyneuropathy: Secondary | ICD-10-CM | POA: Diagnosis not present

## 2019-12-16 DIAGNOSIS — J189 Pneumonia, unspecified organism: Secondary | ICD-10-CM | POA: Diagnosis not present

## 2019-12-16 NOTE — Progress Notes (Signed)
Pulmonary Critical Care Medicine [place]   PULMONARY CRITICAL CARE SERVICE  PROGRESS NOTE  Date of Service: 12/16/2019  Kelly Dawson  CHY:[id number]  DOB: [birth year]   DOA: 12/10/2019  Referring Physician: Merton Border, MD  HPI: Kelly Dawson is a 69 y.o. female seen for follow up of Acute on Chronic Respiratory Failure.  Patient currently is on pressure support mode has been on 28% FiO2 12/5 with a goal of 12 hours  Medications: Reviewed on Rounds  Physical Exam:  Vitals: Temperature 98.4 pulse 76 respiratory 22 blood pressure is 167/82 saturations 98%  Ventilator Settings mode ventilation pressure support FiO2 28% pressure support 12 PEEP 5  . General: Comfortable at this time . Eyes: Grossly normal lids, irises & conjunctiva . ENT: grossly tongue is normal . Neck: no obvious mass . Cardiovascular: S1 S2 normal no gallop . Respiratory: No rhonchi no rales are noted . Abdomen: soft . Skin: no rash seen on limited exam . Musculoskeletal: not rigid . Psychiatric:unable to assess . Neurologic: no seizure no involuntary movements         Lab Data:   Basic Metabolic Panel: Recent Labs  Lab 12/11/19 0618 12/12/19 1015 12/13/19 0924 12/15/19 0534  NA 137 135 138 139  K 4.1 4.0 4.3 4.8  CL 91* 88* 87* 87*  CO2 36* 37* 40* 39*  GLUCOSE 163* 208* 195* 180*  BUN 15 20 26* 30*  CREATININE 0.49 0.61 0.61 0.68  CALCIUM 9.2 9.5 9.3 9.7  MG 2.0  --  2.0 2.2  PHOS 3.5  --  4.0 3.4    ABG: Recent Labs  Lab 12/10/19 1600 12/13/19 1214  PHART 7.435 7.401  PCO2ART 55.5* 69.5*  PO2ART 87.0 77.8*  HCO3 36.7* 42.3*  O2SAT 97.2 96.0    Liver Function Tests: Recent Labs  Lab 12/11/19 0618  AST 20  ALT 16  ALKPHOS 97  BILITOT 0.6  PROT 6.2*  ALBUMIN 2.6*   No results for input(s): LIPASE, AMYLASE in the last 168 hours. No results for input(s): AMMONIA in the last 168 hours.  CBC: Recent Labs  Lab 12/11/19 0618 12/13/19 0924  12/15/19 0534  WBC 5.5 7.5 8.1  NEUTROABS 3.6  --   --   HGB 8.2* 9.1* 9.7*  HCT 27.7* 30.5* 32.4*  MCV 91.7 90.5 90.0  PLT 250 232 227    Cardiac Enzymes: No results for input(s): CKTOTAL, CKMB, CKMBINDEX, TROPONINI in the last 168 hours.  BNP (last 3 results) No results for input(s): BNP in the last 8760 hours.  ProBNP (last 3 results) No results for input(s): PROBNP in the last 8760 hours.  Radiological Exams: DG CHEST PORT 1 VIEW  Result Date: 12/15/2019 CLINICAL DATA:  Ventilator dependent respiratory failure. EXAM: PORTABLE CHEST 1 VIEW COMPARISON:  One-view chest x-ray 12/15/2019 FINDINGS: Tracheostomy is stable. Lung volumes are low. Atherosclerotic changes are present the arch. Interstitial and airspace opacities are stable. Bilateral pleural effusions are stable. IMPRESSION: Stable appearance of bilateral interstitial and airspace disease and effusions. This likely reflects congestive heart failure. Electronically Signed   By: San Morelle M.D.   On: 12/15/2019 05:41   ECHOCARDIOGRAM COMPLETE  Result Date: 12/15/2019   ECHOCARDIOGRAM REPORT   Patient Name:   Kelly Dawson Date of Exam: 12/15/2019 Medical Rec #:  [id number]      Height:       64.0 in Accession #:    [id number]     Weight:  228.0 lb Date of Birth:  1950-12-27     BSA:          2.07 m Patient Age:    13 years       BP:           106/82 mmHg Patient Gender: F              HR:           66 bpm. Exam Location:  Inpatient Procedure: 2D Echo, Cardiac Doppler and Color Doppler Indications:     CAD of Native Vessel 414.01 / I25.10  History:         Patient has no prior history of Echocardiogram examinations.                  COVID19 Positive. Respiratory Failure.  Sonographer:     Elmarie Shiley Dance Referring Phys:  [id number] Orpah Cobb Diagnosing Phys: Orpah Cobb MD  Sonographer Comments: Echo performed with patient supine and on artificial respirator and no subcostal window. IMPRESSIONS  1. Left ventricular  ejection fraction, by visual estimation, is 65 to 70%. The left ventricle has normal function. There is borderline left ventricular hypertrophy.  2. Left ventricular diastolic parameters are consistent with Grade I diastolic dysfunction (impaired relaxation).  3. Global right ventricle has normal systolic function.The right ventricular size is normal. No increase in right ventricular wall thickness.  4. The pericardial effusion is posterior to the left ventricle.  5. Trivial pericardial effusion is present.  6. Moderate mitral annular calcification.  7. The mitral valve is normal in structure. Trivial mitral valve regurgitation. No evidence of mitral stenosis.  8. The tricuspid valve is normal in structure. Tricuspid valve regurgitation is trivial.  9. The aortic valve is tricuspid. Aortic valve regurgitation is not visualized. Mild aortic valve sclerosis without stenosis. 10. Moderate plaque invoving the ascending aorta. 11. The tricuspid valve is normal in structure. FINDINGS  Left Ventricle: Left ventricular ejection fraction, by visual estimation, is 65 to 70%. The left ventricle has normal function. The left ventricle has no regional wall motion abnormalities. There is borderline left ventricular hypertrophy. Left ventricular diastolic parameters are consistent with Grade I diastolic dysfunction (impaired relaxation). Normal left atrial pressure. Right Ventricle: The right ventricular size is normal. No increase in right ventricular wall thickness. Global RV systolic function is has normal systolic function. Left Atrium: Left atrial size was mildly dilated. Right Atrium: Right atrial size was normal in size Pericardium: Trivial pericardial effusion is present. The pericardial effusion is posterior to the left ventricle. Mitral Valve: The mitral valve is normal in structure. Moderate mitral annular calcification. Trivial mitral valve regurgitation. No evidence of mitral valve stenosis by observation. Tricuspid  Valve: The tricuspid valve is normal in structure. Tricuspid valve regurgitation is trivial. Aortic Valve: The aortic valve is tricuspid. Aortic valve regurgitation is not visualized. Mild aortic valve sclerosis is present, with no evidence of aortic valve stenosis. Pulmonic Valve: The pulmonic valve was normal in structure. Pulmonic valve regurgitation is not visualized. Pulmonic regurgitation is not visualized. Aorta: The aortic root and ascending aorta are structurally normal, with no evidence of dilitation. There is moderate, atheroma plaque involving the ascending aorta. IAS/Shunts: No atrial level shunt detected by color flow Doppler. There is no evidence of a patent foramen ovale. No ventricular septal defect is seen or detected. There is no evidence of an atrial septal defect.  LEFT VENTRICLE PLAX 2D LVIDd:         4.21 cm  Diastology LVIDs:         2.51 cm  LV e' lateral:   9.14 cm/s LV PW:         1.39 cm  LV E/e' lateral: 8.7 LV IVS:        1.11 cm  LV e' medial:    4.46 cm/s LVOT diam:     1.80 cm  LV E/e' medial:  17.7 LV SV:         56 ml LV SV Index:   25.53 LVOT Area:     2.54 cm  RIGHT VENTRICLE RV Basal diam:  2.53 cm RV S prime:     11.20 cm/s TAPSE (M-mode): 1.5 cm LEFT ATRIUM             Index       RIGHT ATRIUM           Index LA diam:        4.60 cm 2.22 cm/m  RA Area:     10.80 cm LA Vol (A2C):   78.1 ml 37.77 ml/m RA Volume:   20.60 ml  9.96 ml/m LA Vol (A4C):   51.0 ml 24.66 ml/m LA Biplane Vol: 65.3 ml 31.58 ml/m  AORTIC VALVE LVOT Vmax:   127.00 cm/s LVOT Vmean:  79.500 cm/s LVOT VTI:    0.285 m  AORTA Ao Root diam: 3.20 cm Ao Asc diam:  2.70 cm MITRAL VALVE MV Area (PHT): 3.21 cm             SHUNTS MV PHT:        68.44 msec           Systemic VTI:  0.29 m MV Decel Time: 236 msec             Systemic Diam: 1.80 cm MV E velocity: 79.10 cm/s 103 cm/s MV A velocity: 88.80 cm/s 70.3 cm/s MV E/A ratio:  0.89       1.5  Orpah Cobb MD Electronically signed by Orpah Cobb MD Signature  Date/Time: 12/15/2019/10:53:49 AM    Final     Assessment/Plan Active Problems:   Acute on chronic respiratory failure with hypoxia (HCC)   COVID-19 virus infection   Myasthenia gravis (HCC)   Multifocal pneumonia   Pneumonia due to COVID-19 virus   Critical illness polyneuropathy (HCC)   1. Acute on chronic respiratory failure with hypoxia, continue with full support for now wean on pressure support of 12 hours as tolerated continue pulmonary toilet 2. COVID-19 virus infection in recovery phase we will continue to follow 3. Myasthenia gravis at baseline continue supportive care 4. Multifocal pneumonia treated 5. Critical illness polyneuropathy will need ongoing physical therapy 6. Pneumonia due to COVID-19 treated continue to follow   I have personally seen and evaluated the patient, evaluated laboratory and imaging results, formulated the assessment and plan and placed orders. The Patient requires high complexity decision making with multiple systems involvement.  Rounds were done with the Respiratory Therapy Director and Staff therapists and discussed with nursing staff also.  Yevonne Pax, MD [facility] Pulmonary Critical Care Medicine Sleep Medicine

## 2019-12-16 NOTE — Consult Note (Signed)
Ref: McBroom, [place] S, MD   Subjective:  Awake. No chest pain. Monitor shows sinus rhythm. Echocardiogram showed normal LV systolic function. EF 65-70 %.  Objective:  Vital Signs in the last 24 hours: BP: ()/()  Arterial Line BP: ()/()   Physical Exam: BP Readings from Last 1 Encounters:  No data found for BP     Wt Readings from Last 1 Encounters:  No data found for Wt    Weight change:  There is no height or weight on file to calculate BMI. HEENT: [place]/AT, Eyes-Blue, Conjunctiva-Pale pink, Sclera-Non-icteric Neck: No JVD, No bruit, Trachea midline. Lungs:  Clear, Bilateral. Cardiac:  Regular rhythm, normal S1 and S2, no S3. II/VI systolic murmur. Abdomen:  Soft, non-tender. BS present. Extremities:  No edema present. No cyanosis. No clubbing. CNS: AxOx3, Cranial nerves grossly intact, moves all 4 extremities.  Skin: Warm and dry.   Intake/Output from previous day: No intake/output data recorded.    Lab Results: BMET    Component Value Date/Time   NA 139 12/15/2019 0534   NA 138 12/13/2019 0924   NA 135 12/12/2019 1015   K 4.8 12/15/2019 0534   K 4.3 12/13/2019 0924   K 4.0 12/12/2019 1015   CL 87 (L) 12/15/2019 0534   CL 87 (L) 12/13/2019 0924   CL 88 (L) 12/12/2019 1015   CO2 39 (H) 12/15/2019 0534   CO2 40 (H) 12/13/2019 0924   CO2 37 (H) 12/12/2019 1015   GLUCOSE 180 (H) 12/15/2019 0534   GLUCOSE 195 (H) 12/13/2019 0924   GLUCOSE 208 (H) 12/12/2019 1015   BUN 30 (H) 12/15/2019 0534   BUN 26 (H) 12/13/2019 0924   BUN 20 12/12/2019 1015   CREATININE 0.68 12/15/2019 0534   CREATININE 0.61 12/13/2019 0924   CREATININE 0.61 12/12/2019 1015   CALCIUM 9.7 12/15/2019 0534   CALCIUM 9.3 12/13/2019 0924   CALCIUM 9.5 12/12/2019 1015   GFRNONAA >60 12/15/2019 0534   GFRNONAA >60 12/13/2019 0924   GFRNONAA >60 12/12/2019 1015   GFRAA >60 12/15/2019 0534   GFRAA >60 12/13/2019 0924   GFRAA >60 12/12/2019 1015   CBC    Component Value Date/Time   WBC 8.1  12/15/2019 0534   RBC 3.60 (L) 12/15/2019 0534   HGB 9.7 (L) 12/15/2019 0534   HCT 32.4 (L) 12/15/2019 0534   PLT 227 12/15/2019 0534   MCV 90.0 12/15/2019 0534   MCH 26.9 12/15/2019 0534   MCHC 29.9 (L) 12/15/2019 0534   RDW 17.2 (H) 12/15/2019 0534   LYMPHSABS 1.3 12/11/2019 0618   MONOABS 0.4 12/11/2019 0618   EOSABS 0.1 12/11/2019 0618   BASOSABS 0.0 12/11/2019 0618   HEPATIC Function Panel Recent Labs    12/11/19 0618  PROT 6.2*   HEMOGLOBIN A1C No components found for: HGA1C,  MPG CARDIAC ENZYMES No results found for: CKTOTAL, CKMB, CKMBINDEX, TROPONINI BNP No results for input(s): PROBNP in the last 8760 hours. TSH Recent Labs    12/11/19 0618  TSH 4.042   CHOLESTEROL No results for input(s): CHOL in the last 8760 hours.  Scheduled Meds: Continuous Infusions: PRN Meds:.  Assessment/Plan: Acute on chronic respiratory failure with hypoxemia COVID-19 infection/pneumonia Myasthenia gravis Type 2 DM, uncontrolled, hyperglycemia Polyneuropathy H/O diastolic left heart failure Tobacco use disorder  Improved heart rate. Continue medical treatment. With good LV function, possible non-cardiac/pulmonary interstitial fibrosis or muscular weakness for respiratory distress more likely.   LOS: 0 days   Time spent including chart review, lab review,  examination, discussion with patient : 30 min   Dixie Dials  MD  12/16/2019, 12:33 PM

## 2019-12-17 ENCOUNTER — Other Ambulatory Visit (HOSPITAL_COMMUNITY): Payer: Medicare Other

## 2019-12-17 DIAGNOSIS — J9621 Acute and chronic respiratory failure with hypoxia: Secondary | ICD-10-CM | POA: Diagnosis not present

## 2019-12-17 DIAGNOSIS — G6281 Critical illness polyneuropathy: Secondary | ICD-10-CM | POA: Diagnosis not present

## 2019-12-17 DIAGNOSIS — J189 Pneumonia, unspecified organism: Secondary | ICD-10-CM | POA: Diagnosis not present

## 2019-12-17 DIAGNOSIS — U071 COVID-19: Secondary | ICD-10-CM | POA: Diagnosis not present

## 2019-12-17 LAB — BASIC METABOLIC PANEL
Anion gap: 12 (ref 5–15)
BUN: 28 mg/dL — ABNORMAL HIGH (ref 8–23)
CO2: 39 mmol/L — ABNORMAL HIGH (ref 22–32)
Calcium: 9.5 mg/dL (ref 8.9–10.3)
Chloride: 88 mmol/L — ABNORMAL LOW (ref 98–111)
Creatinine, Ser: 0.8 mg/dL (ref 0.44–1.00)
GFR calc Af Amer: >60 mL/min (ref >60)
GFR calc non Af Amer: >60 mL/min (ref >60)
Glucose, Bld: 126 mg/dL — ABNORMAL HIGH (ref 70–99)
Potassium: 4 mmol/L (ref 3.5–5.1)
Sodium: 139 mmol/L (ref 135–145)

## 2019-12-17 LAB — CBC
HCT: 29.3 % — ABNORMAL LOW (ref 36.0–46.0)
Hemoglobin: 8.6 g/dL — ABNORMAL LOW (ref 12.0–15.0)
MCH: 27 pg (ref 26.0–34.0)
MCHC: 29.4 g/dL — ABNORMAL LOW (ref 30.0–36.0)
MCV: 91.8 fL (ref 80.0–100.0)
Platelets: 219 10*3/uL (ref 150–400)
RBC: 3.19 MIL/uL — ABNORMAL LOW (ref 3.87–5.11)
RDW: 18.1 % — ABNORMAL HIGH (ref 11.5–15.5)
WBC: 9.6 10*3/uL (ref 4.0–10.5)
nRBC: 0 % (ref 0.0–0.2)

## 2019-12-17 LAB — MAGNESIUM: Magnesium: 2.1 mg/dL (ref 1.7–2.4)

## 2019-12-17 LAB — PHOSPHORUS: Phosphorus: 3.8 mg/dL (ref 2.5–4.6)

## 2019-12-17 MED ORDER — GENERIC EXTERNAL MEDICATION
Status: DC
Start: ? — End: 2019-12-17

## 2019-12-17 NOTE — Progress Notes (Signed)
Pulmonary Critical Care Medicine [facility] [place]   PULMONARY CRITICAL CARE SERVICE  PROGRESS NOTE  Date of Service: 12/17/2019  Kelly Dawson  NWG:[id number]  DOB: [birth year]   DOA: 12/10/2019  Referring Physician: Carron Curie, MD  HPI: BEN SANZ is a 69 y.o. female seen for follow up of Acute on Chronic Respiratory Failure.  Patient right now is on pressure support mode has been on 35% FiO2 with good saturations right now goal is 12 hours  Medications: Reviewed on Rounds  Physical Exam:  Vitals: Temperature 98.6 pulse 90 respiratory rate 20 blood pressure is 133/76 saturations 100%  Ventilator Settings mode ventilation pressure support FiO2 35% tidal line 310 pressure 114 PEEP 5  . General: Comfortable at this time . Eyes: Grossly normal lids, irises & conjunctiva . ENT: grossly tongue is normal . Neck: no obvious mass . Cardiovascular: S1 S2 normal no gallop . Respiratory: No rhonchi coarse breath sounds are noted at this time . Abdomen: soft . Skin: no rash seen on limited exam . Musculoskeletal: not rigid . Psychiatric:unable to assess . Neurologic: no seizure no involuntary movements         Lab Data:   Basic Metabolic Panel: Recent Labs  Lab 12/11/19 0618 12/12/19 1015 12/13/19 0924 12/15/19 0534 12/17/19 0625  NA 137 135 138 139 139  K 4.1 4.0 4.3 4.8 4.0  CL 91* 88* 87* 87* 88*  CO2 36* 37* 40* 39* 39*  GLUCOSE 163* 208* 195* 180* 126*  BUN 15 20 26* 30* 28*  CREATININE 0.49 0.61 0.61 0.68 0.80  CALCIUM 9.2 9.5 9.3 9.7 9.5  MG 2.0  --  2.0 2.2 2.1  PHOS 3.5  --  4.0 3.4 3.8    ABG: Recent Labs  Lab 12/10/19 1600 12/13/19 1214  PHART 7.435 7.401  PCO2ART 55.5* 69.5*  PO2ART 87.0 77.8*  HCO3 36.7* 42.3*  O2SAT 97.2 96.0    Liver Function Tests: Recent Labs  Lab 12/11/19 0618  AST 20  ALT 16  ALKPHOS 97  BILITOT 0.6  PROT 6.2*  ALBUMIN 2.6*   No results for input(s): LIPASE, AMYLASE in the last 168 hours. No  results for input(s): AMMONIA in the last 168 hours.  CBC: Recent Labs  Lab 12/11/19 0618 12/13/19 0924 12/15/19 0534 12/17/19 0625  WBC 5.5 7.5 8.1 9.6  NEUTROABS 3.6  --   --   --   HGB 8.2* 9.1* 9.7* 8.6*  HCT 27.7* 30.5* 32.4* 29.3*  MCV 91.7 90.5 90.0 91.8  PLT 250 232 227 219    Cardiac Enzymes: No results for input(s): CKTOTAL, CKMB, CKMBINDEX, TROPONINI in the last 168 hours.  BNP (last 3 results) No results for input(s): BNP in the last 8760 hours.  ProBNP (last 3 results) No results for input(s): PROBNP in the last 8760 hours.  Radiological Exams: DG CHEST PORT 1 VIEW  Result Date: 12/17/2019 CLINICAL DATA:  69 year old female with pneumonia EXAM: PORTABLE CHEST 1 VIEW COMPARISON:  12/17/2019, 12/15/2019 FINDINGS: Cardiomediastinal silhouette unchanged in size and contour with low lung volumes. Similar appearance of coarsened interstitial markings bilaterally with no new confluent airspace disease pleural effusion or pneumothorax. Tracheostomy tube terminates suitably above the carina. Aortic calcifications IMPRESSION: Similar appearance of the lungs with low lung volumes and reticular opacities. Tracheostomy tube terminates suitably above the carina. Electronically Signed   By: Gilmer Mor D.O.   On: 12/17/2019 09:31   DG CHEST PORT 1 VIEW  Result Date: 12/17/2019 CLINICAL DATA:  Update status, prior history of ventilator dependent respiratory failure EXAM: PORTABLE CHEST 1 VIEW COMPARISON:  12/15/2019 FINDINGS: Endotracheal tube terminates 2.5 cm above the carina. Increased interstitial markings, unchanged across multiple priors. Multifocal pneumonia is favored over interstitial edema. Technically, chronic interstitial lung disease would also be possible, although remote priors are not available to support this diagnosis. No definite pleural effusions.  No pneumothorax. The heart is normal in size.  Thoracic aortic atherosclerosis. IMPRESSION: Endotracheal tube  terminates 2.5 cm above the carina. Stable increased interstitial markings bilaterally, favoring multifocal pneumonia over interstitial edema. Electronically Signed   By: Julian Hy M.D.   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 Acute on chronic respiratory failure hypoxia plan is to continue with full support on pressure support goal is for 12 hours today 2. COVID-19 virus infection resolving phase 3. Myasthenia gravis no change 4. Multifocal pneumonia treated clinically is improving 5. Pneumonia due to COVID-19 slow to improve 6. Critical illness polyneuropathy baseline we will continue to follow   I have personally seen and evaluated the patient, evaluated laboratory and imaging results, formulated the assessment and plan and placed orders. The Patient requires high complexity decision making with multiple systems involvement.  Rounds were done with the Respiratory Therapy Director and Staff therapists and discussed with nursing staff also.  Allyne Gee, MD [facility] Pulmonary Critical Care Medicine Sleep Medicine

## 2019-12-18 DIAGNOSIS — U071 COVID-19: Secondary | ICD-10-CM | POA: Diagnosis not present

## 2019-12-18 DIAGNOSIS — G6281 Critical illness polyneuropathy: Secondary | ICD-10-CM | POA: Diagnosis not present

## 2019-12-18 DIAGNOSIS — J189 Pneumonia, unspecified organism: Secondary | ICD-10-CM | POA: Diagnosis not present

## 2019-12-18 DIAGNOSIS — J9621 Acute and chronic respiratory failure with hypoxia: Secondary | ICD-10-CM | POA: Diagnosis not present

## 2019-12-18 NOTE — Progress Notes (Addendum)
Pulmonary Critical Care Medicine [facility] [place]   PULMONARY CRITICAL CARE SERVICE  PROGRESS NOTE  Date of Service: 12/18/2019  Kelly Dawson  PNT:[id number]  DOB: July 23, 1951   DOA: 12/10/2019  Referring Physician: Carron Curie, MD  HPI: Kelly Dawson is a 69 y.o. female seen for follow up of Acute on Chronic Respiratory Failure.  Patient is on pressure support with an FiO2 of 35% for 16-hour goal today.  Currently satting well no fever or distress.  Medications: Reviewed on Rounds  Physical Exam:  Vitals: Pulse 65 respirations 23 BP 117/70 O2 sat 100% temp 98.7  Ventilator Settings pressure support 14/5 with an FiO2 35%  . General: Comfortable at this time . Eyes: Grossly normal lids, irises & conjunctiva . ENT: grossly tongue is normal . Neck: no obvious mass . Cardiovascular: S1 S2 normal no gallop . Respiratory: No rales or rhonchi noted . Abdomen: soft . Skin: no rash seen on limited exam . Musculoskeletal: not rigid . Psychiatric:unable to assess . Neurologic: no seizure no involuntary movements         Lab Data:   Basic Metabolic Panel: Recent Labs  Lab 12/12/19 1015 12/13/19 0924 12/15/19 0534 12/17/19 0625  NA 135 138 139 139  K 4.0 4.3 4.8 4.0  CL 88* 87* 87* 88*  CO2 37* 40* 39* 39*  GLUCOSE 208* 195* 180* 126*  BUN 20 26* 30* 28*  CREATININE 0.61 0.61 0.68 0.80  CALCIUM 9.5 9.3 9.7 9.5  MG  --  2.0 2.2 2.1  PHOS  --  4.0 3.4 3.8    ABG: Recent Labs  Lab 12/13/19 1214  PHART 7.401  PCO2ART 69.5*  PO2ART 77.8*  HCO3 42.3*  O2SAT 96.0    Liver Function Tests: No results for input(s): AST, ALT, ALKPHOS, BILITOT, PROT, ALBUMIN in the last 168 hours. No results for input(s): LIPASE, AMYLASE in the last 168 hours. No results for input(s): AMMONIA in the last 168 hours.  CBC: Recent Labs  Lab 12/13/19 0924 12/15/19 0534 12/17/19 0625  WBC 7.5 8.1 9.6  HGB 9.1* 9.7* 8.6*  HCT 30.5* 32.4* 29.3*  MCV 90.5 90.0 91.8   PLT 232 227 219    Cardiac Enzymes: No results for input(s): CKTOTAL, CKMB, CKMBINDEX, TROPONINI in the last 168 hours.  BNP (last 3 results) No results for input(s): BNP in the last 8760 hours.  ProBNP (last 3 results) No results for input(s): PROBNP in the last 8760 hours.  Radiological Exams: DG CHEST PORT 1 VIEW  Addendum Date: 12/18/2019   ADDENDUM REPORT: 12/18/2019 04:06 ADDENDUM: Addendum for clarification. Patient has a tracheostomy, not an endotracheal tube. The neck soft tissues are excluded from the single image from and the upper portion of the tube (distinguishing the two) is not visualized on this study, although the tracheostomy is clear on the prior. Remainder of report is unchanged. Electronically Signed   By: Charline Bills M.D.   On: 12/18/2019 04:06   Result Date: 12/18/2019 CLINICAL DATA:  Update status, prior history of ventilator dependent respiratory failure EXAM: PORTABLE CHEST 1 VIEW COMPARISON:  12/15/2019 FINDINGS: Endotracheal tube terminates 2.5 cm above the carina. Increased interstitial markings, unchanged across multiple priors. Multifocal pneumonia is favored over interstitial edema. Technically, chronic interstitial lung disease would also be possible, although remote priors are not available to support this diagnosis. No definite pleural effusions.  No pneumothorax. The heart is normal in size.  Thoracic aortic atherosclerosis. IMPRESSION: Endotracheal tube terminates 2.5 cm above  the carina. Stable increased interstitial markings bilaterally, favoring multifocal pneumonia over interstitial edema. Electronically Signed: By: Julian Hy M.D. On: 12/17/2019 07:37   DG CHEST PORT 1 VIEW  Result Date: 12/17/2019 CLINICAL DATA:  69 year old female with pneumonia EXAM: PORTABLE CHEST 1 VIEW COMPARISON:  12/17/2019, 12/15/2019 FINDINGS: Cardiomediastinal silhouette unchanged in size and contour with low lung volumes. Similar appearance of coarsened  interstitial markings bilaterally with no new confluent airspace disease pleural effusion or pneumothorax. Tracheostomy tube terminates suitably above the carina. Aortic calcifications IMPRESSION: Similar appearance of the lungs with low lung volumes and reticular opacities. Tracheostomy tube terminates suitably above the carina. Electronically Signed   By: Corrie Mckusick D.O.   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 Acute on chronic respiratory failure hypoxia plan is to continue with full support on pressure support goal is for 16 hours today 2. COVID-19 virus infection resolving phase 3. Myasthenia gravis no change 4. Multifocal pneumonia treated clinically is improving 5. Pneumonia due to COVID-19 slow to improve 6. Critical illness polyneuropathy baseline we will continue to follow   I have personally seen and evaluated the patient, evaluated laboratory and imaging results, formulated the assessment and plan and placed orders. The Patient requires high complexity decision making with multiple systems involvement.  Rounds were done with the Respiratory Therapy Director and Staff therapists and discussed with nursing staff also.  Allyne Gee, MD [facility] Pulmonary Critical Care Medicine Sleep Medicine

## 2019-12-19 DIAGNOSIS — J9621 Acute and chronic respiratory failure with hypoxia: Secondary | ICD-10-CM | POA: Diagnosis not present

## 2019-12-19 DIAGNOSIS — U071 COVID-19: Secondary | ICD-10-CM | POA: Diagnosis not present

## 2019-12-19 DIAGNOSIS — G6281 Critical illness polyneuropathy: Secondary | ICD-10-CM | POA: Diagnosis not present

## 2019-12-19 DIAGNOSIS — J189 Pneumonia, unspecified organism: Secondary | ICD-10-CM | POA: Diagnosis not present

## 2019-12-19 LAB — RENAL FUNCTION PANEL
Albumin: 2.8 g/dL — ABNORMAL LOW (ref 3.5–5.0)
Anion gap: 13 (ref 5–15)
BUN: 30 mg/dL — ABNORMAL HIGH (ref 8–23)
CO2: 36 mmol/L — ABNORMAL HIGH (ref 22–32)
Calcium: 9.3 mg/dL (ref 8.9–10.3)
Chloride: 89 mmol/L — ABNORMAL LOW (ref 98–111)
Creatinine, Ser: 0.76 mg/dL (ref 0.44–1.00)
GFR calc Af Amer: >60 mL/min (ref >60)
GFR calc non Af Amer: >60 mL/min (ref >60)
Glucose, Bld: 145 mg/dL — ABNORMAL HIGH (ref 70–99)
Phosphorus: 3.6 mg/dL (ref 2.5–4.6)
Potassium: 4.1 mmol/L (ref 3.5–5.1)
Sodium: 138 mmol/L (ref 135–145)

## 2019-12-19 LAB — CULTURE, RESPIRATORY W GRAM STAIN

## 2019-12-19 LAB — MAGNESIUM: Magnesium: 2.3 mg/dL (ref 1.7–2.4)

## 2019-12-19 LAB — CBC
HCT: 28.4 % — ABNORMAL LOW (ref 36.0–46.0)
Hemoglobin: 8.5 g/dL — ABNORMAL LOW (ref 12.0–15.0)
MCH: 27.4 pg (ref 26.0–34.0)
MCHC: 29.9 g/dL — ABNORMAL LOW (ref 30.0–36.0)
MCV: 91.6 fL (ref 80.0–100.0)
Platelets: 174 10*3/uL (ref 150–400)
RBC: 3.1 MIL/uL — ABNORMAL LOW (ref 3.87–5.11)
RDW: 18.4 % — ABNORMAL HIGH (ref 11.5–15.5)
WBC: 6 10*3/uL (ref 4.0–10.5)
nRBC: 0 % (ref 0.0–0.2)

## 2019-12-19 NOTE — Progress Notes (Signed)
Pulmonary Critical Care Medicine [facility] [place]   PULMONARY CRITICAL CARE SERVICE  PROGRESS NOTE  Date of Service: 12/19/2019  Kelly Dawson  DJM:[id number]  DOB: December 26, 1950   DOA: 12/10/2019  Referring Physician: Carron Curie, MD  HPI: Kelly Dawson is a 69 y.o. female seen for follow up of Acute on Chronic Respiratory Failure.  Patient is on full support on pressure support mode at this time right now is on 35% FiO2 currently on 12/5  Medications: Reviewed on Rounds  Physical Exam:  Vitals: Temperature 97.8 pulse 69 respiratory rate 21 blood pressure is 130/63 saturations 100%  Ventilator Settings mode ventilation pressure support FiO2 35% tidal volume 373 pressure poor 12/5  . General: Comfortable at this time . Eyes: Grossly normal lids, irises & conjunctiva . ENT: grossly tongue is normal . Neck: no obvious mass . Cardiovascular: S1 S2 normal no gallop . Respiratory: No rhonchi no rales are noted at this time . Abdomen: soft . Skin: no rash seen on limited exam . Musculoskeletal: not rigid . Psychiatric:unable to assess . Neurologic: no seizure no involuntary movements         Lab Data:   Basic Metabolic Panel: Recent Labs  Lab 12/13/19 0924 12/15/19 0534 12/17/19 0625 12/19/19 0540  NA 138 139 139 138  K 4.3 4.8 4.0 4.1  CL 87* 87* 88* 89*  CO2 40* 39* 39* 36*  GLUCOSE 195* 180* 126* 145*  BUN 26* 30* 28* 30*  CREATININE 0.61 0.68 0.80 0.76  CALCIUM 9.3 9.7 9.5 9.3  MG 2.0 2.2 2.1 2.3  PHOS 4.0 3.4 3.8 3.6    ABG: Recent Labs  Lab 12/13/19 1214  PHART 7.401  PCO2ART 69.5*  PO2ART 77.8*  HCO3 42.3*  O2SAT 96.0    Liver Function Tests: Recent Labs  Lab 12/19/19 0540  ALBUMIN 2.8*   No results for input(s): LIPASE, AMYLASE in the last 168 hours. No results for input(s): AMMONIA in the last 168 hours.  CBC: Recent Labs  Lab 12/13/19 0924 12/15/19 0534 12/17/19 0625 12/19/19 0540  WBC 7.5 8.1 9.6 6.0  HGB 9.1* 9.7*  8.6* 8.5*  HCT 30.5* 32.4* 29.3* 28.4*  MCV 90.5 90.0 91.8 91.6  PLT 232 227 219 174    Cardiac Enzymes: No results for input(s): CKTOTAL, CKMB, CKMBINDEX, TROPONINI in the last 168 hours.  BNP (last 3 results) No results for input(s): BNP in the last 8760 hours.  ProBNP (last 3 results) No results for input(s): PROBNP in the last 8760 hours.  Radiological Exams: No results found.  Assessment/Plan Active Problems:   Acute on chronic respiratory failure with hypoxia (HCC)   COVID-19 virus infection   Myasthenia gravis (HCC)   Multifocal pneumonia   Pneumonia due to COVID-19 virus   Critical illness polyneuropathy (HCC)   1. Acute on chronic respiratory failure hypoxia plan is to continue with the weaning attempts right now is on pressure support wean we will continue with advancing it as tolerated 2. COVID-19 virus infection treated in resolution phase 3. Myasthenia gravis patient currently is on the ventilator 4. Pneumonia due to COVID-19 treated slow to improve 5. Critical illness polyneuropathy no change we will continue to follow   I have personally seen and evaluated the patient, evaluated laboratory and imaging results, formulated the assessment and plan and placed orders. The Patient requires high complexity decision making with multiple systems involvement.  Rounds were done with the Respiratory Therapy Director and Staff therapists and discussed with nursing  staff also.  Allyne Gee, MD [facility] Pulmonary Critical Care Medicine Sleep Medicine

## 2019-12-20 DIAGNOSIS — G6281 Critical illness polyneuropathy: Secondary | ICD-10-CM | POA: Diagnosis not present

## 2019-12-20 DIAGNOSIS — U071 COVID-19: Secondary | ICD-10-CM | POA: Diagnosis not present

## 2019-12-20 DIAGNOSIS — J9621 Acute and chronic respiratory failure with hypoxia: Secondary | ICD-10-CM | POA: Diagnosis not present

## 2019-12-20 DIAGNOSIS — J189 Pneumonia, unspecified organism: Secondary | ICD-10-CM | POA: Diagnosis not present

## 2019-12-20 NOTE — Progress Notes (Signed)
Pulmonary Critical Care Medicine [facility] [place]   PULMONARY CRITICAL CARE SERVICE  PROGRESS NOTE  Date of Service: 12/20/2019  SHALANDA BROGDEN  VEH:[id number]  DOB: 1951-03-21   DOA: 12/10/2019  Referring Physician: Carron Curie, MD  HPI: LATICHA FERRUCCI is a 69 y.o. female seen for follow up of Acute on Chronic Respiratory Failure.  Patient currently is off the ventilator on the NAG and the goal is for about 3 hours  Medications: Reviewed on Rounds  Physical Exam:  Vitals: Temperature 97.8 pulse 83 respiratory 24 blood pressure is 152/65 saturations 99%  Ventilator Settings off the ventilator on the NAG  . General: Comfortable at this time . Eyes: Grossly normal lids, irises & conjunctiva . ENT: grossly tongue is normal . Neck: no obvious mass . Cardiovascular: S1 S2 normal no gallop . Respiratory: No rhonchi coarse breath sounds . Abdomen: soft . Skin: no rash seen on limited exam . Musculoskeletal: not rigid . Psychiatric:unable to assess . Neurologic: no seizure no involuntary movements         Lab Data:   Basic Metabolic Panel: Recent Labs  Lab 12/15/19 0534 12/17/19 0625 12/19/19 0540  NA 139 139 138  K 4.8 4.0 4.1  CL 87* 88* 89*  CO2 39* 39* 36*  GLUCOSE 180* 126* 145*  BUN 30* 28* 30*  CREATININE 0.68 0.80 0.76  CALCIUM 9.7 9.5 9.3  MG 2.2 2.1 2.3  PHOS 3.4 3.8 3.6    ABG: No results for input(s): PHART, PCO2ART, PO2ART, HCO3, O2SAT in the last 168 hours.  Liver Function Tests: Recent Labs  Lab 12/19/19 0540  ALBUMIN 2.8*   No results for input(s): LIPASE, AMYLASE in the last 168 hours. No results for input(s): AMMONIA in the last 168 hours.  CBC: Recent Labs  Lab 12/15/19 0534 12/17/19 0625 12/19/19 0540  WBC 8.1 9.6 6.0  HGB 9.7* 8.6* 8.5*  HCT 32.4* 29.3* 28.4*  MCV 90.0 91.8 91.6  PLT 227 219 174    Cardiac Enzymes: No results for input(s): CKTOTAL, CKMB, CKMBINDEX, TROPONINI in the last 168 hours.  BNP  (last 3 results) No results for input(s): BNP in the last 8760 hours.  ProBNP (last 3 results) No results for input(s): PROBNP in the last 8760 hours.  Radiological Exams: No results found.  Assessment/Plan Active Problems:   Acute on chronic respiratory failure with hypoxia (HCC)   COVID-19 virus infection   Myasthenia gravis (HCC)   Multifocal pneumonia   Pneumonia due to COVID-19 virus   Critical illness polyneuropathy (HCC)   1. Acute on chronic respiratory failure hypoxia plan is to continue with the NAG continue secretion management pulmonary toilet 2. COVID-19 virus infection treated resolved 3. Myasthenia gravis patient is at her baseline apparently used the ventilator at home prior to being admitted at night 4. Pneumonia due to COVID-19 treated we will continue with supportive care 5. Critical illness polyneuropathy will need ongoing physical therapy   I have personally seen and evaluated the patient, evaluated laboratory and imaging results, formulated the assessment and plan and placed orders. The Patient requires high complexity decision making with multiple systems involvement.  Rounds were done with the Respiratory Therapy Director and Staff therapists and discussed with nursing staff also.  Yevonne Pax, MD [facility] Pulmonary Critical Care Medicine Sleep Medicine

## 2019-12-21 DIAGNOSIS — U071 COVID-19: Secondary | ICD-10-CM | POA: Diagnosis not present

## 2019-12-21 DIAGNOSIS — G6281 Critical illness polyneuropathy: Secondary | ICD-10-CM | POA: Diagnosis not present

## 2019-12-21 DIAGNOSIS — J189 Pneumonia, unspecified organism: Secondary | ICD-10-CM | POA: Diagnosis not present

## 2019-12-21 DIAGNOSIS — J9621 Acute and chronic respiratory failure with hypoxia: Secondary | ICD-10-CM | POA: Diagnosis not present

## 2019-12-21 LAB — CK TOTAL AND CKMB (NOT AT ARMC)
CK, MB: 1.2 ng/mL (ref 0.5–5.0)
Relative Index: INVALID (ref 0.0–2.5)
Total CK: 14 U/L — ABNORMAL LOW (ref 38–234)

## 2019-12-21 LAB — BASIC METABOLIC PANEL
Anion gap: 13 (ref 5–15)
BUN: 28 mg/dL — ABNORMAL HIGH (ref 8–23)
CO2: 36 mmol/L — ABNORMAL HIGH (ref 22–32)
Calcium: 9.7 mg/dL (ref 8.9–10.3)
Chloride: 89 mmol/L — ABNORMAL LOW (ref 98–111)
Creatinine, Ser: 0.74 mg/dL (ref 0.44–1.00)
GFR calc Af Amer: >60 mL/min (ref >60)
GFR calc non Af Amer: >60 mL/min (ref >60)
Glucose, Bld: 116 mg/dL — ABNORMAL HIGH (ref 70–99)
Potassium: 4 mmol/L (ref 3.5–5.1)
Sodium: 138 mmol/L (ref 135–145)

## 2019-12-21 LAB — TROPONIN I (HIGH SENSITIVITY): Troponin I (High Sensitivity): 11 ng/L (ref <18)

## 2019-12-21 LAB — CBC
HCT: 31.1 % — ABNORMAL LOW (ref 36.0–46.0)
Hemoglobin: 9 g/dL — ABNORMAL LOW (ref 12.0–15.0)
MCH: 27.4 pg (ref 26.0–34.0)
MCHC: 28.9 g/dL — ABNORMAL LOW (ref 30.0–36.0)
MCV: 94.5 fL (ref 80.0–100.0)
Platelets: 214 10*3/uL (ref 150–400)
RBC: 3.29 MIL/uL — ABNORMAL LOW (ref 3.87–5.11)
RDW: 18.3 % — ABNORMAL HIGH (ref 11.5–15.5)
WBC: 6.6 10*3/uL (ref 4.0–10.5)
nRBC: 0 % (ref 0.0–0.2)

## 2019-12-21 LAB — VANCOMYCIN, TROUGH: Vancomycin Tr: 13 ug/mL — ABNORMAL LOW (ref 15–20)

## 2019-12-21 NOTE — Consult Note (Signed)
Ref: McBroom, [place] S, MD   Subjective:  Resting comfortably. Had 2 episodes of bradycardia at 12:28 and 14:23 hrs. Currently sinus rhythm at 70 bpm.  Objective:  Vital Signs in the last 24 hours: BP: 160/80, P: 70's. R: 18  Arterial Line BP: ()/()   Physical Exam: BP Readings from Last 1 Encounters:  No data found for BP     Wt Readings from Last 1 Encounters:  No data found for Wt    Weight change:  There is no height or weight on file to calculate BMI. HEENT: [place]/AT, Eyes-Blue, Conjunctiva-Pale pink, Sclera-Non-icteric Neck: No JVD, No bruit, Trachea midline. Lungs:  Clear, Bilateral. Cardiac:  Regular rhythm, normal S1 and S2, no S3. II/VI systolic murmur. Abdomen:  Soft, non-tender. BS present. Extremities:  No edema present. No cyanosis. No clubbing. CNS: AxOx2, Cranial nerves grossly intact, moves all 4 extremities.  Skin: Warm and dry.   Intake/Output from previous day: No intake/output data recorded.    Lab Results: BMET    Component Value Date/Time   NA 138 12/21/2019 0558   NA 138 12/19/2019 0540   NA 139 12/17/2019 0625   K 4.0 12/21/2019 0558   K 4.1 12/19/2019 0540   K 4.0 12/17/2019 0625   CL 89 (L) 12/21/2019 0558   CL 89 (L) 12/19/2019 0540   CL 88 (L) 12/17/2019 0625   CO2 36 (H) 12/21/2019 0558   CO2 36 (H) 12/19/2019 0540   CO2 39 (H) 12/17/2019 0625   GLUCOSE 116 (H) 12/21/2019 0558   GLUCOSE 145 (H) 12/19/2019 0540   GLUCOSE 126 (H) 12/17/2019 0625   BUN 28 (H) 12/21/2019 0558   BUN 30 (H) 12/19/2019 0540   BUN 28 (H) 12/17/2019 0625   CREATININE 0.74 12/21/2019 0558   CREATININE 0.76 12/19/2019 0540   CREATININE 0.80 12/17/2019 0625   CALCIUM 9.7 12/21/2019 0558   CALCIUM 9.3 12/19/2019 0540   CALCIUM 9.5 12/17/2019 0625   GFRNONAA >60 12/21/2019 0558   GFRNONAA >60 12/19/2019 0540   GFRNONAA >60 12/17/2019 0625   GFRAA >60 12/21/2019 0558   GFRAA >60 12/19/2019 0540   GFRAA >60 12/17/2019 0625   CBC    Component Value  Date/Time   WBC 6.6 12/21/2019 0558   RBC 3.29 (L) 12/21/2019 0558   HGB 9.0 (L) 12/21/2019 0558   HCT 31.1 (L) 12/21/2019 0558   PLT 214 12/21/2019 0558   MCV 94.5 12/21/2019 0558   MCH 27.4 12/21/2019 0558   MCHC 28.9 (L) 12/21/2019 0558   RDW 18.3 (H) 12/21/2019 0558   LYMPHSABS 1.3 12/11/2019 0618   MONOABS 0.4 12/11/2019 0618   EOSABS 0.1 12/11/2019 0618   BASOSABS 0.0 12/11/2019 0618   HEPATIC Function Panel Recent Labs    12/11/19 0618  PROT 6.2*   HEMOGLOBIN A1C No components found for: HGA1C,  MPG CARDIAC ENZYMES No results found for: CKTOTAL, CKMB, CKMBINDEX, TROPONINI BNP No results for input(s): PROBNP in the last 8760 hours. TSH Recent Labs    12/11/19 0618  TSH 4.042   CHOLESTEROL No results for input(s): CHOL in the last 8760 hours.  Scheduled Meds: Continuous Infusions: PRN Meds:.  Assessment/Plan: Sinus rhythm with 2:1 AV block, transient Acute on chronic respiratory failure with hypoxemia S/P COVID-19 pneumonia Myasthenia Gravis Type 2 DM, uncontrolled with hyperglycemia Polyneuropathy H/O diastolic left heart failure H/O tobacco use disorder  Hold Labetalol. Decrease Clonidine by 50 %. Increase Hydralazine by 50 %. Agree with troponin I check. Agree with holding  Scopolamine.   LOS: 0 days   Time spent including chart review, lab review, examination, discussion with patient's doctor : 30 min   Orpah Cobb  MD  12/21/2019, 4:12 PM

## 2019-12-21 NOTE — Progress Notes (Signed)
Pulmonary Critical Care Medicine [facility] [place]   PULMONARY CRITICAL CARE SERVICE  PROGRESS NOTE  Date of Service: 12/21/2019  Kelly Dawson  YHC:[id number]  DOB: 24-Jun-1951   DOA: 12/10/2019  Referring Physician: Carron Curie, MD  HPI: Kelly Dawson is a 69 y.o. female seen for follow up of Acute on Chronic Respiratory Failure.  Patient currently is on 3 L and tolerating the nag device fairly well.  Medications: Reviewed on Rounds  Physical Exam:  Vitals: Temperature is 98.4 pulse 63 respiratory rate is 18 blood pressure is 167/68 saturations 100%  Ventilator Settings off the ventilator on the NAG  . General: Comfortable at this time . Eyes: Grossly normal lids, irises & conjunctiva . ENT: grossly tongue is normal . Neck: no obvious mass . Cardiovascular: S1 S2 normal no gallop . Respiratory: No rhonchi no rales are noted at this time . Abdomen: soft . Skin: no rash seen on limited exam . Musculoskeletal: not rigid . Psychiatric:unable to assess . Neurologic: no seizure no involuntary movements         Lab Data:   Basic Metabolic Panel: Recent Labs  Lab 12/15/19 0534 12/17/19 0625 12/19/19 0540 12/21/19 0558  NA 139 139 138 138  K 4.8 4.0 4.1 4.0  CL 87* 88* 89* 89*  CO2 39* 39* 36* 36*  GLUCOSE 180* 126* 145* 116*  BUN 30* 28* 30* 28*  CREATININE 0.68 0.80 0.76 0.74  CALCIUM 9.7 9.5 9.3 9.7  MG 2.2 2.1 2.3  --   PHOS 3.4 3.8 3.6  --     ABG: No results for input(s): PHART, PCO2ART, PO2ART, HCO3, O2SAT in the last 168 hours.  Liver Function Tests: Recent Labs  Lab 12/19/19 0540  ALBUMIN 2.8*   No results for input(s): LIPASE, AMYLASE in the last 168 hours. No results for input(s): AMMONIA in the last 168 hours.  CBC: Recent Labs  Lab 12/15/19 0534 12/17/19 0625 12/19/19 0540  WBC 8.1 9.6 6.0  HGB 9.7* 8.6* 8.5*  HCT 32.4* 29.3* 28.4*  MCV 90.0 91.8 91.6  PLT 227 219 174    Cardiac Enzymes: No results for input(s):  CKTOTAL, CKMB, CKMBINDEX, TROPONINI in the last 168 hours.  BNP (last 3 results) No results for input(s): BNP in the last 8760 hours.  ProBNP (last 3 results) No results for input(s): PROBNP in the last 8760 hours.  Radiological Exams: No results found.  Assessment/Plan Active Problems:   Acute on chronic respiratory failure with hypoxia (HCC)   COVID-19 virus infection   Myasthenia gravis (HCC)   Multifocal pneumonia   Pneumonia due to COVID-19 virus   Critical illness polyneuropathy (HCC)   1. Acute on chronic respiratory failure hypoxia plan is to continue with the NAG wean as tolerated.  Now is requiring 3 L supplemental oxygen 2. COVID-19 virus infection treated we will continue with supportive care 3. Myasthenia gravis slow to improve we will continue with therapy as tolerated continue current management 4. Multifocal pneumonia treated we will monitor 5. COVID-19 pneumonia improving 6. Critical illness polyneuropathy physical therapy as tolerated   I have personally seen and evaluated the patient, evaluated laboratory and imaging results, formulated the assessment and plan and placed orders. The Patient requires high complexity decision making with multiple systems involvement.  Rounds were done with the Respiratory Therapy Director and Staff therapists and discussed with nursing staff also.  Yevonne Pax, MD [facility] Pulmonary Critical Care Medicine Sleep Medicine

## 2019-12-22 DIAGNOSIS — U071 COVID-19: Secondary | ICD-10-CM | POA: Diagnosis not present

## 2019-12-22 DIAGNOSIS — G6281 Critical illness polyneuropathy: Secondary | ICD-10-CM | POA: Diagnosis not present

## 2019-12-22 DIAGNOSIS — J9621 Acute and chronic respiratory failure with hypoxia: Secondary | ICD-10-CM | POA: Diagnosis not present

## 2019-12-22 DIAGNOSIS — J189 Pneumonia, unspecified organism: Secondary | ICD-10-CM | POA: Diagnosis not present

## 2019-12-22 MED ORDER — GENERIC EXTERNAL MEDICATION
Status: DC
Start: ? — End: 2019-12-22

## 2019-12-22 NOTE — Consult Note (Signed)
Ref: McBroom, [place] S, MD   Subjective:  Sitting up and has nausea.  No bradycardia over 24 hours.   Objective:  Vital Signs in the last 24 hours: BP: 130/78, P: 70's R: 18  Arterial Line BP: ()/()   Physical Exam: BP Readings from Last 1 Encounters:  No data found for BP     Wt Readings from Last 1 Encounters:  No data found for Wt    Weight change:  There is no height or weight on file to calculate BMI. HEENT: [place]/AT, Eyes-Blue, Conjunctiva-Pale pink, Sclera-Non-icteric Neck: No JVD, No bruit, Trachea midline. Lungs:  Clear, Bilateral. Cardiac:  Regular rhythm, normal S1 and S2, no S3. II/VI systolic murmur. Abdomen:  Soft, non-tender. BS present. Extremities:  No edema present. No cyanosis. No clubbing. CNS: AxOx2, Cranial nerves grossly intact, moves all 4 extremities.  Skin: Warm and dry.   Intake/Output from previous day: No intake/output data recorded.    Lab Results: BMET    Component Value Date/Time   NA 138 12/21/2019 0558   NA 138 12/19/2019 0540   NA 139 12/17/2019 0625   K 4.0 12/21/2019 0558   K 4.1 12/19/2019 0540   K 4.0 12/17/2019 0625   CL 89 (L) 12/21/2019 0558   CL 89 (L) 12/19/2019 0540   CL 88 (L) 12/17/2019 0625   CO2 36 (H) 12/21/2019 0558   CO2 36 (H) 12/19/2019 0540   CO2 39 (H) 12/17/2019 0625   GLUCOSE 116 (H) 12/21/2019 0558   GLUCOSE 145 (H) 12/19/2019 0540   GLUCOSE 126 (H) 12/17/2019 0625   BUN 28 (H) 12/21/2019 0558   BUN 30 (H) 12/19/2019 0540   BUN 28 (H) 12/17/2019 0625   CREATININE 0.74 12/21/2019 0558   CREATININE 0.76 12/19/2019 0540   CREATININE 0.80 12/17/2019 0625   CALCIUM 9.7 12/21/2019 0558   CALCIUM 9.3 12/19/2019 0540   CALCIUM 9.5 12/17/2019 0625   GFRNONAA >60 12/21/2019 0558   GFRNONAA >60 12/19/2019 0540   GFRNONAA >60 12/17/2019 0625   GFRAA >60 12/21/2019 0558   GFRAA >60 12/19/2019 0540   GFRAA >60 12/17/2019 0625   CBC    Component Value Date/Time   WBC 6.6 12/21/2019 0558   RBC 3.29 (L)  12/21/2019 0558   HGB 9.0 (L) 12/21/2019 0558   HCT 31.1 (L) 12/21/2019 0558   PLT 214 12/21/2019 0558   MCV 94.5 12/21/2019 0558   MCH 27.4 12/21/2019 0558   MCHC 28.9 (L) 12/21/2019 0558   RDW 18.3 (H) 12/21/2019 0558   LYMPHSABS 1.3 12/11/2019 0618   MONOABS 0.4 12/11/2019 0618   EOSABS 0.1 12/11/2019 0618   BASOSABS 0.0 12/11/2019 0618   HEPATIC Function Panel Recent Labs    12/11/19 0618  PROT 6.2*   HEMOGLOBIN A1C No components found for: HGA1C,  MPG CARDIAC ENZYMES Lab Results  Component Value Date   CKTOTAL 14 (L) 12/21/2019   CKMB 1.2 12/21/2019   BNP No results for input(s): PROBNP in the last 8760 hours. TSH Recent Labs    12/11/19 0618  TSH 4.042   CHOLESTEROL No results for input(s): CHOL in the last 8760 hours.  Scheduled Meds: Continuous Infusions: PRN Meds:.  Assessment/Plan: Sinus rhythm Acute on chronic respiratory failure with hypoxemia S/P COVID-19 pneumonia Myasthenia Gravis Type 2 DM Polyneuropathy H/O diastolic left heart failure H/O tobacco use disorder  Continue medical therapy.   LOS: 0 days   Time spent including chart review, lab review, examination, discussion with patient : 30  min   Orpah Cobb  MD  12/22/2019, 2:12 PM

## 2019-12-22 NOTE — Progress Notes (Signed)
Pulmonary Critical Care Medicine [facility] [place]   PULMONARY CRITICAL CARE SERVICE  PROGRESS NOTE  Date of Service: 12/22/2019  Kelly Dawson  [id number]  DOB: 30-Oct-1951   DOA: 12/10/2019  Referring Physician: Carron Curie, MD  HPI: Kelly Dawson is a 69 y.o. female seen for follow up of Acute on Chronic Respiratory Failure.  Patient currently is off the ventilator has been using the NAG on 3 L oxygen  Medications: Reviewed on Rounds  Physical Exam:  Vitals: Temperature is 98.0 pulse 82 respiratory 28 blood pressure is 133/65 saturations 100%  Ventilator Settings off the ventilator on the NAG  . General: Comfortable at this time . Eyes: Grossly normal lids, irises & conjunctiva . ENT: grossly tongue is normal . Neck: no obvious mass . Cardiovascular: S1 S2 normal no gallop . Respiratory: No rhonchi no rales are noted at this time . Abdomen: soft . Skin: no rash seen on limited exam . Musculoskeletal: not rigid . Psychiatric:unable to assess . Neurologic: no seizure no involuntary movements         Lab Data:   Basic Metabolic Panel: Recent Labs  Lab 12/17/19 0625 12/19/19 0540 12/21/19 0558  NA 139 138 138  K 4.0 4.1 4.0  CL 88* 89* 89*  CO2 39* 36* 36*  GLUCOSE 126* 145* 116*  BUN 28* 30* 28*  CREATININE 0.80 0.76 0.74  CALCIUM 9.5 9.3 9.7  MG 2.1 2.3  --   PHOS 3.8 3.6  --     ABG: No results for input(s): PHART, PCO2ART, PO2ART, HCO3, O2SAT in the last 168 hours.  Liver Function Tests: Recent Labs  Lab 12/19/19 0540  ALBUMIN 2.8*   No results for input(s): LIPASE, AMYLASE in the last 168 hours. No results for input(s): AMMONIA in the last 168 hours.  CBC: Recent Labs  Lab 12/17/19 0625 12/19/19 0540 12/21/19 0558  WBC 9.6 6.0 6.6  HGB 8.6* 8.5* 9.0*  HCT 29.3* 28.4* 31.1*  MCV 91.8 91.6 94.5  PLT 219 174 214    Cardiac Enzymes: Recent Labs  Lab 12/21/19 1936  CKTOTAL 14*  CKMB 1.2    BNP (last 3  results) No results for input(s): BNP in the last 8760 hours.  ProBNP (last 3 results) No results for input(s): PROBNP in the last 8760 hours.  Radiological Exams: No results found.  Assessment/Plan Active Problems:   Acute on chronic respiratory failure with hypoxia (HCC)   COVID-19 virus infection   Myasthenia gravis (HCC)   Multifocal pneumonia   Pneumonia due to COVID-19 virus   Critical illness polyneuropathy (HCC)   1. Acute on chronic respiratory failure with hypoxia patient is on the NAG device right now 3 L oxygen continue to try to advance the wean as tolerated.  Cardiology is seeing the patient for issues with bradycardia we will follow up on their recommendations 2. COVID-19 virus infection treated we will continue with supportive care 3. Myasthenia gravis patient is at baseline on nocturnal vent support 4. Pneumonia due to COVID-19 treated we will continue supportive care 5. Critical illness polyneuropathy physical therapy supportive care   I have personally seen and evaluated the patient, evaluated laboratory and imaging results, formulated the assessment and plan and placed orders. The Patient requires high complexity decision making with multiple systems involvement.  Rounds were done with the Respiratory Therapy Director and Staff therapists and discussed with nursing staff also.  Yevonne Pax, MD [facility] Pulmonary Critical Care Medicine Sleep Medicine

## 2019-12-22 NOTE — Consult Note (Signed)
Infectious Disease Consultation   Kelly Dawson  [id number]  DOB: [birth year]  DOA: 12/10/2019  Requesting physician: Dr.Brown  Reason for consultation: Antibiotic recommendations   History of Present Illness: Kelly Dawson is an 69 y.o. female with pleasantly delusional myasthenia gravis with a trilogy vent use at night, diabetes mellitus, hypertension, pulmonary hypertension, history of congestive heart failure, COPD, history of acute kidney injury requiring short-term dialysis, depression was admitted to the outside hospital with complaints of hypoxemia, fever, shortness of breath.  She was initially diagnosed with COVID-19 pneumonia on 10/13/2020 prior to this admission.  She had a prolonged course in the hospital in the setting of COVID-19 infection with myasthenia gravis.  She was intubated twice.  She continued to have respiratory decompensation and eventually received tracheostomy on 11/20/2019.  She also had pneumonia with MRSA and was treated with IV vancomycin.  She also received treatment with Decadron, remdesivir, convalescent plasma for COVID-19 infection.  She underwent PEG tube placement by IR on 12/06/2019.  Due to her complex medical problems she was transferred to [facility] for further management and care. She had episodes of bradycardia and was seen by cardiology.  Her beta-blocker was discontinued.  Her clonidine dose was decreased and hydralazine dose increased.  Scopolamine was also held.  Respiratory cultures from here that were collected on 12/17/2019 showing moderate staph aureus, resistant to oxacillin.  She is currently on treatment with IV vancomycin.  She currently has trach in place.  Continues to complain of shortness of breath, cough, increased secretions despite being on vancomycin empirically start for aspiration.  She is also complaining of diarrhea.  She says she had 4 loose stools today.  Denies having any abdominal pain.    Review of  Systems:  Review of system negative except as mentioned above in the HPI.   Past Medical History: Past Medical History:  Diagnosis Date  . Acute on chronic respiratory failure with hypoxia (HCC)   . COVID-19 virus infection   . Critical illness polyneuropathy (HCC)   . Multifocal pneumonia   . Myasthenia gravis (HCC)   . Pneumonia due to COVID-19 virus   Coronary artery disease, depression, hypertension, COPD, pulmonary hypertension  Past Surgical History: Hysterectomy, tracheostomy, PEG tube placement  Allergies: No known drug allergies  Social History: Former smoker, no mention of alcohol or recreational drug abuse  Family History: History of myasthenia gravis   Physical Exam: Vitals: Temperature 98, pulse 82, respiratory rate 28, blood pressure 133/65, oxygen saturation 100% General: Awake, complaint shortness of breath but not in any acute distress at this time. Eyes: PERLA, EOMI, irises appear normal, anicteric sclera  ENMT: external ears and nose appear normal, normal hearing or hard of hearing, Lips appears normal,   Neck: Has trach in place CVS: S1-S2, no murmur Respiratory: Decreased at the lower lobes, occasional rhonchi, no wheezing Abdomen: soft nontender, nondistended, normal bowel sounds Musculoskeletal: No edema Neuro: She has debility with generalized weakness Psych:stable mood and affect, mental status Skin: no rashes  Data reviewed:  I have personally reviewed following labs and imaging studies Labs:  CBC: Recent Labs  Lab 12/17/19 0625 12/19/19 0540 12/21/19 0558  WBC 9.6 6.0 6.6  HGB 8.6* 8.5* 9.0*  HCT 29.3* 28.4* 31.1*  MCV 91.8 91.6 94.5  PLT 219 174 214    Basic Metabolic Panel: Recent Labs  Lab 12/17/19 0625 12/17/19 0625 12/19/19 0540 12/21/19 0558  NA 139  --  138 138  K 4.0   < > 4.1 4.0  CL 88*  --  89* 89*  CO2 39*  --  36* 36*  GLUCOSE 126*  --  145* 116*  BUN 28*  --  30* 28*  CREATININE 0.80  --  0.76 0.74   CALCIUM 9.5  --  9.3 9.7  MG 2.1  --  2.3  --   PHOS 3.8  --  3.6  --    < > = values in this interval not displayed.   GFR CrCl cannot be calculated (Unknown ideal weight.). Liver Function Tests: Recent Labs  Lab 12/19/19 0540  ALBUMIN 2.8*   No results for input(s): LIPASE, AMYLASE in the last 168 hours. No results for input(s): AMMONIA in the last 168 hours. Coagulation profile No results for input(s): INR, PROTIME in the last 168 hours.  Cardiac Enzymes: Recent Labs  Lab 12/21/19 1936  CKTOTAL 14*  CKMB 1.2   BNP: Invalid input(s): POCBNP CBG: No results for input(s): GLUCAP in the last 168 hours. D-Dimer No results for input(s): DDIMER in the last 72 hours. Hgb A1c No results for input(s): HGBA1C in the last 72 hours. Lipid Profile No results for input(s): CHOL, HDL, LDLCALC, TRIG, CHOLHDL, LDLDIRECT in the last 72 hours. Thyroid function studies No results for input(s): TSH, T4TOTAL, T3FREE, THYROIDAB in the last 72 hours.  Invalid input(s): FREET3 Anemia work up No results for input(s): VITAMINB12, FOLATE, FERRITIN, TIBC, IRON, RETICCTPCT in the last 72 hours. Urinalysis No results found for: COLORURINE, APPEARANCEUR, LABSPEC, PHURINE, GLUCOSEU, HGBUR, BILIRUBINUR, KETONESUR, PROTEINUR, UROBILINOGEN, NITRITE, LEUKOCYTESUR   Microbiology Recent Results (from the past 240 hour(s))  Culture, respiratory (non-expectorated)     Status: None   Collection Time: 12/17/19 12:08 PM   Specimen: Tracheal Aspirate; Respiratory  Result Value Ref Range Status   Specimen Description TRACHEAL ASPIRATE  Final   Special Requests NONE  Final   Gram Stain   Final    RARE WBC PRESENT, PREDOMINANTLY PMN MODERATE GRAM POSITIVE COCCI Performed at [facility], [address], [place], [place] [address]    Culture MODERATE STAPHYLOCOCCUS AUREUS  Final   Report Status 12/19/2019 FINAL  Final   Organism ID, Bacteria STAPHYLOCOCCUS AUREUS  Final      Susceptibility    Staphylococcus aureus - MIC*    CIPROFLOXACIN <=0.5 SENSITIVE Sensitive     ERYTHROMYCIN >=8 RESISTANT Resistant     GENTAMICIN <=0.5 SENSITIVE Sensitive     OXACILLIN >=4 RESISTANT Resistant     TETRACYCLINE <=1 SENSITIVE Sensitive     VANCOMYCIN <=0.5 SENSITIVE Sensitive     TRIMETH/SULFA <=10 SENSITIVE Sensitive     CLINDAMYCIN <=0.25 SENSITIVE Sensitive     RIFAMPIN <=0.5 SENSITIVE Sensitive     Inducible Clindamycin NEGATIVE Sensitive     * MODERATE STAPHYLOCOCCUS AUREUS       Inpatient Medications:   Please see MAR   Radiological Exams on Admission: No results found.  Impression/Recommendations Active Problems:   Acute on chronic respiratory failure with hypoxia (HCC)   COVID-19 virus infection   Myasthenia gravis (HCC)   Multifocal pneumonia   Pneumonia due to COVID-19 virus   Critical illness polyneuropathy (HCC) Pneumonia with MRSA Bradycardia Diabetes mellitus type 1 Anemia Dysphagia Protein calorie malnutrition Diarrhea Obesity  Acute on chronic respiratory failure with hypoxemia: Multifactorial.  She had COVID-19 infection with pneumonia.  Subsequently had pneumonia with MRSA.  She is on treatment with IV vancomycin.  However, continues to  complain of symptoms while on IV vancomycin.  Also patient is obese and requiring high dose of vancomycin to maintain the trough and she is at high risk for renal failure.  Therefore will discontinue the IV vancomycin and switch to ciprofloxacin and complete the treatment for a total duration of 7 days including the days that she received IV vancomycin.  Continue to monitor on telemetry.  Pulmonary following.  She has myasthenia gravis and dysphagia and high risk for aspiration and worsening respiratory failure secondary to aspiration pneumonia.  If her respiratory status worsens would recommend to repeat respiratory cultures also repeat chest imaging preferably chest CT without contrast to better evaluate.  Pneumonia:  Respiratory cultures showing MRSA.  She already was treated with IV vancomycin at the outside facility for MRSA pneumonia.  Respiratory cultures that were repeated from your also showing staph aureus which is resistant to oxacillin.  Has been on treatment with IV vancomycin.  However, patient requiring high dose of vancomycin and she is high risk for developing renal insufficiency from the high dose of IV vancomycin.  Therefore would recommend to discontinue the IV vancomycin and complete treatment with ciprofloxacin.  She has 2 more days left to complete the 7-day treatment.  However, as mentioned above she is very high risk for recurrent pneumonia, worsening respiratory failure secondary to dysphagia, risk of aspiration.  She is afebrile at this time without any leukocytosis.  Diarrhea: She is complaining of loose stools.  However, denies having any abdominal pain.  Ordered stool for C. difficile.  If positive please start her on p.o. vancomycin.  Dietitian following the patient and patient was started on fiber supplement.  Stool softeners are on hold.  Bradycardia: Patient had episodes of bradycardia thought to be secondary to her labetalol which has been held.  Cardiology consulted.  Her dose of clonidine was decreased and hydralazine increased.  Continue to monitor on telemetry.  Myasthenia gravis/critical illness myopathy: She is on prednisone and Mestinon.  She is status post IVIG on December 8.  Continue supportive management per primary team.  Follow-up with neurology on discharge.  Dysphagia: Unfortunately due to her dysphagia she is high risk for aspiration and worsening respiratory failure secondary to aspiration pneumonia.  Diabetes mellitus: Continue medications and management per the primary team.  Anemia: She is status post transfusion.  Continue to monitor hemoglobin, further work-up and management per primary team.  Protein calorie malnutrition: Management per primary  team.  Unfortunately due to her complex medical problems she is very high risk for worsening and decompensation.  Plan of care discussed with the primary team.     Thank you for this consultation.    Yaakov Guthrie M.D. 12/22/2019, 3:38 PM

## 2019-12-23 DIAGNOSIS — J9621 Acute and chronic respiratory failure with hypoxia: Secondary | ICD-10-CM | POA: Diagnosis not present

## 2019-12-23 DIAGNOSIS — G6281 Critical illness polyneuropathy: Secondary | ICD-10-CM | POA: Diagnosis not present

## 2019-12-23 DIAGNOSIS — J189 Pneumonia, unspecified organism: Secondary | ICD-10-CM | POA: Diagnosis not present

## 2019-12-23 DIAGNOSIS — U071 COVID-19: Secondary | ICD-10-CM | POA: Diagnosis not present

## 2019-12-23 LAB — BASIC METABOLIC PANEL
Anion gap: 10 (ref 5–15)
BUN: 25 mg/dL — ABNORMAL HIGH (ref 8–23)
CO2: 38 mmol/L — ABNORMAL HIGH (ref 22–32)
Calcium: 9.3 mg/dL (ref 8.9–10.3)
Chloride: 90 mmol/L — ABNORMAL LOW (ref 98–111)
Creatinine, Ser: 0.76 mg/dL (ref 0.44–1.00)
GFR calc Af Amer: >60 mL/min (ref >60)
GFR calc non Af Amer: >60 mL/min (ref >60)
Glucose, Bld: 144 mg/dL — ABNORMAL HIGH (ref 70–99)
Potassium: 4.3 mmol/L (ref 3.5–5.1)
Sodium: 138 mmol/L (ref 135–145)

## 2019-12-23 NOTE — Progress Notes (Signed)
Pulmonary Critical Care Medicine [facility] [place]   PULMONARY CRITICAL CARE SERVICE  PROGRESS NOTE  Date of Service: 12/23/2019  Kelly Dawson  [id number]  DOB: 1951/07/19   DOA: 12/10/2019  Referring Physician: Carron Curie, MD  HPI: Kelly Dawson is a 69 y.o. female seen for follow up of Acute on Chronic Respiratory Failure.  Patient is pressure support today did add 4 hours of NAG and today the goal was to try to go for about 8 hours.  Medications: Reviewed on Rounds  Physical Exam:  Vitals: Temperature is 97.9 pulse 87 respiratory 24 blood pressure is 160/73 saturations 99%  Ventilator Settings pressure support FiO2 is 30% pressure support 14/5  . General: Comfortable at this time . Eyes: Grossly normal lids, irises & conjunctiva . ENT: grossly tongue is normal . Neck: no obvious mass . Cardiovascular: S1 S2 normal no gallop . Respiratory: Scattered coarse breath sounds noted bilaterally . Abdomen: soft . Skin: no rash seen on limited exam . Musculoskeletal: not rigid . Psychiatric:unable to assess . Neurologic: no seizure no involuntary movements         Lab Data:   Basic Metabolic Panel: Recent Labs  Lab 12/17/19 0625 12/19/19 0540 12/21/19 0558 12/23/19 0649  NA 139 138 138 138  K 4.0 4.1 4.0 4.3  CL 88* 89* 89* 90*  CO2 39* 36* 36* 38*  GLUCOSE 126* 145* 116* 144*  BUN 28* 30* 28* 25*  CREATININE 0.80 0.76 0.74 0.76  CALCIUM 9.5 9.3 9.7 9.3  MG 2.1 2.3  --   --   PHOS 3.8 3.6  --   --     ABG: No results for input(s): PHART, PCO2ART, PO2ART, HCO3, O2SAT in the last 168 hours.  Liver Function Tests: Recent Labs  Lab 12/19/19 0540  ALBUMIN 2.8*   No results for input(s): LIPASE, AMYLASE in the last 168 hours. No results for input(s): AMMONIA in the last 168 hours.  CBC: Recent Labs  Lab 12/17/19 0625 12/19/19 0540 12/21/19 0558  WBC 9.6 6.0 6.6  HGB 8.6* 8.5* 9.0*  HCT 29.3* 28.4* 31.1*  MCV 91.8 91.6 94.5  PLT  219 174 214    Cardiac Enzymes: Recent Labs  Lab 12/21/19 1936  CKTOTAL 14*  CKMB 1.2    BNP (last 3 results) No results for input(s): BNP in the last 8760 hours.  ProBNP (last 3 results) No results for input(s): PROBNP in the last 8760 hours.  Radiological Exams: No results found.  Assessment/Plan Active Problems:   Acute on chronic respiratory failure with hypoxia (HCC)   COVID-19 virus infection   Myasthenia gravis (HCC)   Multifocal pneumonia   Pneumonia due to COVID-19 virus   Critical illness polyneuropathy (HCC)   1. Acute on chronic respiratory failure hypoxia plan is to wean on the NAG as tolerated the goal is for 8 hours.  We will continue with secretion management supportive care at this time 2. COVID-19 virus infection treated in resolution phase 3. Myasthenia gravis no change patient is on supportive care with the vent 4. Multifocal pneumonia treated 5. 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 The Patient requires high complexity decision making with multiple systems involvement.  Rounds were done with the Respiratory Therapy Director and Staff therapists and discussed with  nursing staff also.  Allyne Gee, MD [facility] Pulmonary Critical Care Medicine Sleep Medicine

## 2019-12-24 ENCOUNTER — Other Ambulatory Visit (HOSPITAL_COMMUNITY): Payer: Medicare Other

## 2019-12-24 DIAGNOSIS — J189 Pneumonia, unspecified organism: Secondary | ICD-10-CM | POA: Diagnosis not present

## 2019-12-24 DIAGNOSIS — U071 COVID-19: Secondary | ICD-10-CM | POA: Diagnosis not present

## 2019-12-24 DIAGNOSIS — J9621 Acute and chronic respiratory failure with hypoxia: Secondary | ICD-10-CM | POA: Diagnosis not present

## 2019-12-24 DIAGNOSIS — G6281 Critical illness polyneuropathy: Secondary | ICD-10-CM | POA: Diagnosis not present

## 2019-12-24 LAB — C DIFFICILE QUICK SCREEN W PCR REFLEX
C Diff antigen: NEGATIVE
C Diff interpretation: NOT DETECTED
C Diff toxin: NEGATIVE

## 2019-12-24 NOTE — Progress Notes (Addendum)
Pulmonary Critical Care Medicine [facility] [place]   PULMONARY CRITICAL CARE SERVICE  PROGRESS NOTE  Date of Service: 12/24/2019  Kelly Dawson  [id number]  DOB: 1951/01/06   DOA: 12/10/2019  Referring Physician: Carron Curie, MD  HPI: Kelly Dawson is a 69 y.o. female seen for follow up of Acute on Chronic Respiratory Failure.  Patient had a 4-hour goal today on the night however only did 2-1/2 hours and became tachycardic and tachypneic.  Now currently resting back on full support ventilator  Medications: Reviewed on Rounds  Physical Exam:  Vitals: Pulse 80 respirations 21 BP 166/77 O2 sat 99% temp 98.1  Ventilator Settings ventilator mode AC PC rate of 15 expiratory pressure of 18 PEEP of 5 FiO2 30%  . General: Comfortable at this time . Eyes: Grossly normal lids, irises & conjunctiva . ENT: grossly tongue is normal . Neck: no obvious mass . Cardiovascular: S1 S2 normal no gallop . Respiratory: No rales or rhonchi noted . Abdomen: soft . Skin: no rash seen on limited exam . Musculoskeletal: not rigid . Psychiatric:unable to assess . Neurologic: no seizure no involuntary movements         Lab Data:   Basic Metabolic Panel: Recent Labs  Lab 12/19/19 0540 12/21/19 0558 12/23/19 0649  NA 138 138 138  K 4.1 4.0 4.3  CL 89* 89* 90*  CO2 36* 36* 38*  GLUCOSE 145* 116* 144*  BUN 30* 28* 25*  CREATININE 0.76 0.74 0.76  CALCIUM 9.3 9.7 9.3  MG 2.3  --   --   PHOS 3.6  --   --     ABG: No results for input(s): PHART, PCO2ART, PO2ART, HCO3, O2SAT in the last 168 hours.  Liver Function Tests: Recent Labs  Lab 12/19/19 0540  ALBUMIN 2.8*   No results for input(s): LIPASE, AMYLASE in the last 168 hours. No results for input(s): AMMONIA in the last 168 hours.  CBC: Recent Labs  Lab 12/19/19 0540 12/21/19 0558  WBC 6.0 6.6  HGB 8.5* 9.0*  HCT 28.4* 31.1*  MCV 91.6 94.5  PLT 174 214    Cardiac Enzymes: Recent Labs  Lab  12/21/19 1936  CKTOTAL 14*  CKMB 1.2    BNP (last 3 results) No results for input(s): BNP in the last 8760 hours.  ProBNP (last 3 results) No results for input(s): PROBNP in the last 8760 hours.  Radiological Exams: DG CHEST PORT 1 VIEW  Result Date: 12/24/2019 CLINICAL DATA:  Hypoxia. EXAM: PORTABLE CHEST 1 VIEW COMPARISON:  12/17/2019 FINDINGS: Tracheostomy tube unchanged. Lungs are hypoinflated with mild diffuse bilateral interstitial prominence. No lobar consolidation. No definite effusion. Cardiomediastinal silhouette and remainder of the exam is unchanged. IMPRESSION: Hypoinflation with mild stable bilateral interstitial prominence. Tracheostomy tube unchanged. Electronically Signed   By: Elberta Fortis M.D.   On: 12/24/2019 12:23    Assessment/Plan Active Problems:   Acute on chronic respiratory failure with hypoxia (HCC)   COVID-19 virus infection   Myasthenia gravis (HCC)   Multifocal pneumonia   Pneumonia due to COVID-19 virus   Critical illness polyneuropathy (HCC)   1. Acute on chronic respiratory failure hypoxia plan is to continue to attempt weaning on nag device.  Patient does have some frothy secretions in the chest x-ray has been ordered.  Continue present pulmonary toilet supportive measures at this time. 2. COVID-19 virus infection treated in resolution phase 3. Myasthenia gravis no change patient is on supportive care with the vent 4. Multifocal pneumonia  treated 5. 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 The Patient requires high complexity decision making with multiple systems involvement.  Rounds were done with the Respiratory Therapy Director and Staff therapists and discussed with nursing staff also.  Allyne Gee, MD  [facility] Pulmonary Critical Care Medicine Sleep Medicine

## 2019-12-25 DIAGNOSIS — G6281 Critical illness polyneuropathy: Secondary | ICD-10-CM | POA: Diagnosis not present

## 2019-12-25 DIAGNOSIS — J9621 Acute and chronic respiratory failure with hypoxia: Secondary | ICD-10-CM | POA: Diagnosis not present

## 2019-12-25 DIAGNOSIS — U071 COVID-19: Secondary | ICD-10-CM | POA: Diagnosis not present

## 2019-12-25 DIAGNOSIS — J189 Pneumonia, unspecified organism: Secondary | ICD-10-CM | POA: Diagnosis not present

## 2019-12-25 LAB — MAGNESIUM: Magnesium: 2.4 mg/dL (ref 1.7–2.4)

## 2019-12-25 LAB — CBC
HCT: 32.5 % — ABNORMAL LOW (ref 36.0–46.0)
Hemoglobin: 9.7 g/dL — ABNORMAL LOW (ref 12.0–15.0)
MCH: 27.8 pg (ref 26.0–34.0)
MCHC: 29.8 g/dL — ABNORMAL LOW (ref 30.0–36.0)
MCV: 93.1 fL (ref 80.0–100.0)
Platelets: 252 10*3/uL (ref 150–400)
RBC: 3.49 MIL/uL — ABNORMAL LOW (ref 3.87–5.11)
RDW: 19.6 % — ABNORMAL HIGH (ref 11.5–15.5)
WBC: 6.9 10*3/uL (ref 4.0–10.5)
nRBC: 0 % (ref 0.0–0.2)

## 2019-12-25 LAB — BASIC METABOLIC PANEL
Anion gap: 12 (ref 5–15)
BUN: 20 mg/dL (ref 8–23)
CO2: 37 mmol/L — ABNORMAL HIGH (ref 22–32)
Calcium: 9.6 mg/dL (ref 8.9–10.3)
Chloride: 89 mmol/L — ABNORMAL LOW (ref 98–111)
Creatinine, Ser: 0.76 mg/dL (ref 0.44–1.00)
GFR calc Af Amer: >60 mL/min (ref >60)
GFR calc non Af Amer: >60 mL/min (ref >60)
Glucose, Bld: 131 mg/dL — ABNORMAL HIGH (ref 70–99)
Potassium: 3.7 mmol/L (ref 3.5–5.1)
Sodium: 138 mmol/L (ref 135–145)

## 2019-12-25 NOTE — Progress Notes (Addendum)
Pulmonary Critical Care Medicine [facility] [place]   PULMONARY CRITICAL CARE SERVICE  PROGRESS NOTE  Date of Service: 12/25/2019  Kelly Dawson  VZD:[id number]  DOB: [birth year]   DOA: 12/10/2019  Referring Physician: Carron Curie, MD  HPI: Kelly Dawson is a 69 y.o. female seen for follow up of Acute on Chronic Respiratory Failure.  Patient pleaded 4 hours on nag today 4 L/min.  Has a goal of 8 hours at this time.  Medications: Reviewed on Rounds  Physical Exam:  Vitals: Pulse 86 respirations 19 BP 168/75 O2 sat 99% temp 98.1  Ventilator Settings NAG 4 L/min  . General: Comfortable at this time . Eyes: Grossly normal lids, irises & conjunctiva . ENT: grossly tongue is normal . Neck: no obvious mass . Cardiovascular: S1 S2 normal no gallop . Respiratory: No rales or rhonchi noted . Abdomen: soft . Skin: no rash seen on limited exam . Musculoskeletal: not rigid . Psychiatric:unable to assess . Neurologic: no seizure no involuntary movements         Lab Data:   Basic Metabolic Panel: Recent Labs  Lab 12/19/19 0540 12/21/19 0558 12/23/19 0649 12/25/19 0622  NA 138 138 138 138  K 4.1 4.0 4.3 3.7  CL 89* 89* 90* 89*  CO2 36* 36* 38* 37*  GLUCOSE 145* 116* 144* 131*  BUN 30* 28* 25* 20  CREATININE 0.76 0.74 0.76 0.76  CALCIUM 9.3 9.7 9.3 9.6  MG 2.3  --   --  2.4  PHOS 3.6  --   --   --     ABG: No results for input(s): PHART, PCO2ART, PO2ART, HCO3, O2SAT in the last 168 hours.  Liver Function Tests: Recent Labs  Lab 12/19/19 0540  ALBUMIN 2.8*   No results for input(s): LIPASE, AMYLASE in the last 168 hours. No results for input(s): AMMONIA in the last 168 hours.  CBC: Recent Labs  Lab 12/19/19 0540 12/21/19 0558 12/25/19 0622  WBC 6.0 6.6 6.9  HGB 8.5* 9.0* 9.7*  HCT 28.4* 31.1* 32.5*  MCV 91.6 94.5 93.1  PLT 174 214 252    Cardiac Enzymes: Recent Labs  Lab 12/21/19 1936  CKTOTAL 14*  CKMB 1.2    BNP (last 3  results) No results for input(s): BNP in the last 8760 hours.  ProBNP (last 3 results) No results for input(s): PROBNP in the last 8760 hours.  Radiological Exams: DG CHEST PORT 1 VIEW  Result Date: 12/24/2019 CLINICAL DATA:  Hypoxia. EXAM: PORTABLE CHEST 1 VIEW COMPARISON:  12/17/2019 FINDINGS: Tracheostomy tube unchanged. Lungs are hypoinflated with mild diffuse bilateral interstitial prominence. No lobar consolidation. No definite effusion. Cardiomediastinal silhouette and remainder of the exam is unchanged. IMPRESSION: Hypoinflation with mild stable bilateral interstitial prominence. Tracheostomy tube unchanged. Electronically Signed   By: Elberta Fortis M.D.   On: 12/24/2019 12:23    Assessment/Plan Active Problems:   Acute on chronic respiratory failure with hypoxia (HCC)   COVID-19 virus infection   Myasthenia gravis (HCC)   Multifocal pneumonia   Pneumonia due to COVID-19 virus   Critical illness polyneuropathy (HCC)   1. Acute on chronic respiratory failure hypoxia plan is to continue to attempt weaning on nag device.  Continue supportive measures and pulmonary toilet. 2. COVID-19 virus infection treated in resolution phase 3. Myasthenia gravis no change patient is on supportive care with the vent 4. Multifocal pneumonia treated 5. 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 The Patient requires high complexity decision making with multiple systems involvement.  Rounds were done with the Respiratory Therapy Director and Staff therapists and discussed with nursing staff also.  Allyne Gee, MD [facility] Pulmonary Critical Care Medicine Sleep Medicine

## 2019-12-26 DIAGNOSIS — U071 COVID-19: Secondary | ICD-10-CM | POA: Diagnosis not present

## 2019-12-26 DIAGNOSIS — G6281 Critical illness polyneuropathy: Secondary | ICD-10-CM | POA: Diagnosis not present

## 2019-12-26 DIAGNOSIS — J9621 Acute and chronic respiratory failure with hypoxia: Secondary | ICD-10-CM | POA: Diagnosis not present

## 2019-12-26 DIAGNOSIS — J189 Pneumonia, unspecified organism: Secondary | ICD-10-CM | POA: Diagnosis not present

## 2019-12-26 LAB — CBC
HCT: 36.9 % (ref 36.0–46.0)
Hemoglobin: 10.5 g/dL — ABNORMAL LOW (ref 12.0–15.0)
MCH: 27.5 pg (ref 26.0–34.0)
MCHC: 28.5 g/dL — ABNORMAL LOW (ref 30.0–36.0)
MCV: 96.6 fL (ref 80.0–100.0)
Platelets: 370 10*3/uL (ref 150–400)
RBC: 3.82 MIL/uL — ABNORMAL LOW (ref 3.87–5.11)
RDW: 19.3 % — ABNORMAL HIGH (ref 11.5–15.5)
WBC: 24.3 10*3/uL — ABNORMAL HIGH (ref 4.0–10.5)
nRBC: 0 % (ref 0.0–0.2)

## 2019-12-26 LAB — URINALYSIS, ROUTINE W REFLEX MICROSCOPIC
Bilirubin Urine: NEGATIVE
Glucose, UA: 150 mg/dL — AB
Hgb urine dipstick: NEGATIVE
Ketones, ur: NEGATIVE mg/dL
Leukocytes,Ua: NEGATIVE
Nitrite: NEGATIVE
Protein, ur: 30 mg/dL — AB
Specific Gravity, Urine: 1.019 (ref 1.005–1.030)
pH: 5 (ref 5.0–8.0)

## 2019-12-26 LAB — BASIC METABOLIC PANEL
Anion gap: 13 (ref 5–15)
BUN: 28 mg/dL — ABNORMAL HIGH (ref 8–23)
CO2: 37 mmol/L — ABNORMAL HIGH (ref 22–32)
Calcium: 9.8 mg/dL (ref 8.9–10.3)
Chloride: 89 mmol/L — ABNORMAL LOW (ref 98–111)
Creatinine, Ser: 1 mg/dL (ref 0.44–1.00)
GFR calc Af Amer: >60 mL/min (ref >60)
GFR calc non Af Amer: 58 mL/min — ABNORMAL LOW (ref >60)
Glucose, Bld: 319 mg/dL — ABNORMAL HIGH (ref 70–99)
Potassium: 4.2 mmol/L (ref 3.5–5.1)
Sodium: 139 mmol/L (ref 135–145)

## 2019-12-26 LAB — MAGNESIUM: Magnesium: 2.7 mg/dL — ABNORMAL HIGH (ref 1.7–2.4)

## 2019-12-26 LAB — PHOSPHORUS: Phosphorus: 6.5 mg/dL — ABNORMAL HIGH (ref 2.5–4.6)

## 2019-12-26 LAB — TROPONIN I (HIGH SENSITIVITY)
Troponin I (High Sensitivity): 41 ng/L — ABNORMAL HIGH (ref <18)
Troponin I (High Sensitivity): 34 ng/L — ABNORMAL HIGH (ref <18)

## 2019-12-26 NOTE — Progress Notes (Signed)
Pulmonary Critical Care Medicine [facility] [place]   PULMONARY CRITICAL CARE SERVICE  PROGRESS NOTE  Date of Service: 12/26/2019  Kelly Dawson  ZOX:[id number]  DOB: 08-13-1951   DOA: 12/10/2019  Referring Physician: Carron Curie, MD  HPI: Kelly Dawson is a 69 y.o. female seen for follow up of Acute on Chronic Respiratory Failure.  The patient is currently on the NAG and was using oxygen however only lasted about 1.5 hours and had to be placed back on the ventilator right now is on pressure assist control FiO2 30%  Medications: Reviewed on Rounds  Physical Exam:  Vitals: Temperature 99.1 pulse 88 respiratory rate 27 blood pressure 146/70 saturations 98%  Ventilator Settings off the ventilator on the NAG  . General: Comfortable at this time . Eyes: Grossly normal lids, irises & conjunctiva . ENT: grossly tongue is normal . Neck: no obvious mass . Cardiovascular: S1 S2 normal no gallop . Respiratory: No rhonchi no rales are noted at this time . Abdomen: soft . Skin: no rash seen on limited exam . Musculoskeletal: not rigid . Psychiatric:unable to assess . Neurologic: no seizure no involuntary movements         Lab Data:   Basic Metabolic Panel: Recent Labs  Lab 12/21/19 0558 12/23/19 0649 12/25/19 0622 12/26/19 1139  NA 138 138 138 139  K 4.0 4.3 3.7 4.2  CL 89* 90* 89* 89*  CO2 36* 38* 37* 37*  GLUCOSE 116* 144* 131* 319*  BUN 28* 25* 20 28*  CREATININE 0.74 0.76 0.76 1.00  CALCIUM 9.7 9.3 9.6 9.8  MG  --   --  2.4 2.7*  PHOS  --   --   --  6.5*    ABG: No results for input(s): PHART, PCO2ART, PO2ART, HCO3, O2SAT in the last 168 hours.  Liver Function Tests: No results for input(s): AST, ALT, ALKPHOS, BILITOT, PROT, ALBUMIN in the last 168 hours. No results for input(s): LIPASE, AMYLASE in the last 168 hours. No results for input(s): AMMONIA in the last 168 hours.  CBC: Recent Labs  Lab 12/21/19 0558 12/25/19 0622 12/26/19 1139  WBC  6.6 6.9 24.3*  HGB 9.0* 9.7* 10.5*  HCT 31.1* 32.5* 36.9  MCV 94.5 93.1 96.6  PLT 214 252 370    Cardiac Enzymes: Recent Labs  Lab 12/21/19 1936  CKTOTAL 14*  CKMB 1.2    BNP (last 3 results) No results for input(s): BNP in the last 8760 hours.  ProBNP (last 3 results) No results for input(s): PROBNP in the last 8760 hours.  Radiological Exams: No results found.  Assessment/Plan Active Problems:   Acute on chronic respiratory failure with hypoxia (HCC)   COVID-19 virus infection   Myasthenia gravis (HCC)   Multifocal pneumonia   Pneumonia due to COVID-19 virus   Critical illness polyneuropathy (HCC)   1. Acute on chronic respiratory failure with hypoxia plan is to continue with attempting weaning on NAG 2. COVID-19 virus infection in resolution phase 3. Myasthenia gravis could possibly be contributing to her failure to wean off the ventilator 4. Pneumonia due to COVID-19 treated 5. Critical illness polyneuropathy physical therapy as tolerated   I have personally seen and evaluated the patient, evaluated laboratory and imaging results, formulated the assessment and plan and placed orders. The Patient requires high complexity decision making with multiple systems involvement.  Rounds were done with the Respiratory Therapy Director and Staff therapists and discussed with nursing staff also.  Yevonne Pax, MD [facility]  Pulmonary Critical Care Medicine Sleep Medicine

## 2019-12-27 DIAGNOSIS — R0603 Acute respiratory distress: Secondary | ICD-10-CM | POA: Diagnosis not present

## 2019-12-27 DIAGNOSIS — G6281 Critical illness polyneuropathy: Secondary | ICD-10-CM | POA: Diagnosis not present

## 2019-12-27 DIAGNOSIS — U071 COVID-19: Secondary | ICD-10-CM | POA: Diagnosis not present

## 2019-12-27 DIAGNOSIS — R001 Bradycardia, unspecified: Secondary | ICD-10-CM

## 2019-12-27 DIAGNOSIS — J9621 Acute and chronic respiratory failure with hypoxia: Secondary | ICD-10-CM | POA: Diagnosis not present

## 2019-12-27 LAB — URINE CULTURE: Culture: NO GROWTH

## 2019-12-27 LAB — TROPONIN I (HIGH SENSITIVITY): Troponin I (High Sensitivity): 35 ng/L — ABNORMAL HIGH (ref <18)

## 2019-12-27 NOTE — Consult Note (Signed)
Ref: McBroom, [place] S, MD   Subjective:  Awake. No chest pain. Some nausea and back pain. Monitor shows sinus rhythm and 99% oxygen saturation. Repeat EKG shows normal sinus rhythm after discovering lead placement issue for prior EKG suggesting lateral wall MI. Troponin I levels are consistent with demand ischemia.   Objective:  Vital Signs in the last 24 hours: BP: ()/()  Arterial Line BP: ()/()   Physical Exam: BP Readings from Last 1 Encounters:  No data found for BP     Wt Readings from Last 1 Encounters:  No data found for Wt    Weight change:  There is no height or weight on file to calculate BMI. HEENT: /AT, Eyes-Blue, PERL, EOMI, Conjunctiva-Pink, Sclera-Non-icteric Neck: No JVD, No bruit, Trachea midline. Lungs:  Clear, Bilateral. Cardiac:  Regular rhythm, normal S1 and S2, no S3. II/VI systolic murmur. Abdomen:  Soft, non-tender. BS present. Extremities:  No edema present. No cyanosis. No clubbing. CNS: AxOx3, Cranial nerves grossly intact, moves all 4 extremities.  Skin: Warm and dry.   Intake/Output from previous day: No intake/output data recorded.    Lab Results: BMET    Component Value Date/Time   NA 139 12/26/2019 1139   NA 138 12/25/2019 0622   NA 138 12/23/2019 0649   K 4.2 12/26/2019 1139   K 3.7 12/25/2019 0622   K 4.3 12/23/2019 0649   CL 89 (L) 12/26/2019 1139   CL 89 (L) 12/25/2019 0622   CL 90 (L) 12/23/2019 0649   CO2 37 (H) 12/26/2019 1139   CO2 37 (H) 12/25/2019 0622   CO2 38 (H) 12/23/2019 0649   GLUCOSE 319 (H) 12/26/2019 1139   GLUCOSE 131 (H) 12/25/2019 0622   GLUCOSE 144 (H) 12/23/2019 0649   BUN 28 (H) 12/26/2019 1139   BUN 20 12/25/2019 0622   BUN 25 (H) 12/23/2019 0649   CREATININE 1.00 12/26/2019 1139   CREATININE 0.76 12/25/2019 0622   CREATININE 0.76 12/23/2019 0649   CALCIUM 9.8 12/26/2019 1139   CALCIUM 9.6 12/25/2019 0622   CALCIUM 9.3 12/23/2019 0649   GFRNONAA 58 (L) 12/26/2019 1139   GFRNONAA >60 12/25/2019  0622   GFRNONAA >60 12/23/2019 0649   GFRAA >60 12/26/2019 1139   GFRAA >60 12/25/2019 0622   GFRAA >60 12/23/2019 0649   CBC    Component Value Date/Time   WBC 24.3 (H) 12/26/2019 1139   RBC 3.82 (L) 12/26/2019 1139   HGB 10.5 (L) 12/26/2019 1139   HCT 36.9 12/26/2019 1139   PLT 370 12/26/2019 1139   MCV 96.6 12/26/2019 1139   MCH 27.5 12/26/2019 1139   MCHC 28.5 (L) 12/26/2019 1139   RDW 19.3 (H) 12/26/2019 1139   LYMPHSABS 1.3 12/11/2019 0618   MONOABS 0.4 12/11/2019 0618   EOSABS 0.1 12/11/2019 0618   BASOSABS 0.0 12/11/2019 0618   HEPATIC Function Panel Recent Labs    12/11/19 0618  PROT 6.2*   HEMOGLOBIN A1C No components found for: HGA1C,  MPG CARDIAC ENZYMES Lab Results  Component Value Date   CKTOTAL 14 (L) 12/21/2019   CKMB 1.2 12/21/2019   BNP No results for input(s): PROBNP in the last 8760 hours. TSH Recent Labs    12/11/19 0618  TSH 4.042   CHOLESTEROL No results for input(s): CHOL in the last 8760 hours.  Scheduled Meds: Continuous Infusions: PRN Meds:.  Assessment/Plan: Acute on chronic respiratory failure with hypoxemia S/P COVID-19 pneumonia Myasthenia Gravis Type 2 DM Polyneuropathy H/O diastolic left heart failure H/O  tobacco use disorder  Continue medical therapy.   LOS: 0 days   Time spent including chart review, lab review, examination, discussion with patient and nurse : 30  min   Dixie Dials  MD  12/27/2019, 10:13 AM

## 2019-12-27 NOTE — Progress Notes (Signed)
Pulmonary Critical Care Medicine [place]   PULMONARY CRITICAL CARE SERVICE  PROGRESS NOTE  Date of Service: 12/27/2019  Kelly Dawson  [id number]:[id number]  DOB: 1951/10/27   DOA: 12/10/2019  Referring Physician: Merton Border, MD  HPI: Kelly Dawson is a 69 y.o. female seen for follow up of Acute on Chronic Respiratory Failure.  Patient continues to have some issues with the bradycardia after reviewing the literature it is possible that she could be having bradycardia secondary to the treatment for the myasthenia gravis itself.  Since the patient needs ongoing therapy it may be reasonable to consider evaluation for pacemaker according to the data.  Will discuss with the primary care team  Medications: Reviewed on Rounds  Physical Exam:  Vitals: Temperature 98.0 pulse 91 respiratory 22 blood pressure is 144/71 saturations 98%  Ventilator Settings mode ventilation pressure assist control FiO2 is 30% tidal volume 357 PEEP 5  . General: Comfortable at this time . Eyes: Grossly normal lids, irises & conjunctiva . ENT: grossly tongue is normal . Neck: no obvious mass . Cardiovascular: S1 S2 normal no gallop . Respiratory: Coarse breath sounds few rhonchi . Abdomen: soft . Skin: no rash seen on limited exam . Musculoskeletal: not rigid . Psychiatric:unable to assess . Neurologic: no seizure no involuntary movements         Lab Data:   Basic Metabolic Panel: Recent Labs  Lab 12/21/19 0558 12/23/19 0649 12/25/19 0622 12/26/19 1139  NA 138 138 138 139  K 4.0 4.3 3.7 4.2  CL 89* 90* 89* 89*  CO2 36* 38* 37* 37*  GLUCOSE 116* 144* 131* 319*  BUN 28* 25* 20 28*  CREATININE 0.74 0.76 0.76 1.00  CALCIUM 9.7 9.3 9.6 9.8  MG  --   --  2.4 2.7*  PHOS  --   --   --  6.5*    ABG: No results for input(s): PHART, PCO2ART, PO2ART, HCO3, O2SAT in the last 168 hours.  Liver Function Tests: No results for input(s): AST, ALT, ALKPHOS, BILITOT, PROT, ALBUMIN in the  last 168 hours. No results for input(s): LIPASE, AMYLASE in the last 168 hours. No results for input(s): AMMONIA in the last 168 hours.  CBC: Recent Labs  Lab 12/21/19 0558 12/25/19 0622 12/26/19 1139  WBC 6.6 6.9 24.3*  HGB 9.0* 9.7* 10.5*  HCT 31.1* 32.5* 36.9  MCV 94.5 93.1 96.6  PLT 214 252 370    Cardiac Enzymes: Recent Labs  Lab 12/21/19 1936  CKTOTAL 14*  CKMB 1.2    BNP (last 3 results) No results for input(s): BNP in the last 8760 hours.  ProBNP (last 3 results) No results for input(s): PROBNP in the last 8760 hours.  Radiological Exams: No results found.  Assessment/Plan Active Problems:   Acute on chronic respiratory failure with hypoxia (HCC)   COVID-19 virus infection   Myasthenia gravis ([place])   Multifocal pneumonia   Pneumonia due to COVID-19 virus   Critical illness polyneuropathy ([place])   1. Acute on chronic respiratory failure with hypoxia plan is going to be to continue with full support on the ventilator patient was attempted on the NAG failed will have respiratory therapy try pressure support 2. COVID-19 virus infection treated we will continue to monitor now in resolution phase 3. COVID-19 pneumonia treated follow-up radiologically 4. Myasthenia gravis she continues on pyridostigmine may be responsible for the episodic bradycardia that she is having would consider reassessment by cardiology 5. Multifocal pneumonia treated we will  continue with present management 6. Critical illness polyneuropathy physical therapy as tolerated   I have personally seen and evaluated the patient, evaluated laboratory and imaging results, formulated the assessment and plan and placed orders. The Patient requires high complexity decision making with multiple systems involvement.  Rounds were done with the Respiratory Therapy Director and Staff therapists and discussed with nursing staff also.  Time 35 minutes  Yevonne Pax, MD [facility] Pulmonary Critical Care  Medicine Sleep Medicine

## 2019-12-28 DIAGNOSIS — G6281 Critical illness polyneuropathy: Secondary | ICD-10-CM | POA: Diagnosis not present

## 2019-12-28 DIAGNOSIS — U071 COVID-19: Secondary | ICD-10-CM | POA: Diagnosis not present

## 2019-12-28 DIAGNOSIS — J9621 Acute and chronic respiratory failure with hypoxia: Secondary | ICD-10-CM | POA: Diagnosis not present

## 2019-12-28 DIAGNOSIS — R0603 Acute respiratory distress: Secondary | ICD-10-CM | POA: Diagnosis not present

## 2019-12-28 LAB — CBC
HCT: 31.6 % — ABNORMAL LOW (ref 36.0–46.0)
Hemoglobin: 9.1 g/dL — ABNORMAL LOW (ref 12.0–15.0)
MCH: 27.3 pg (ref 26.0–34.0)
MCHC: 28.8 g/dL — ABNORMAL LOW (ref 30.0–36.0)
MCV: 94.9 fL (ref 80.0–100.0)
Platelets: 229 10*3/uL (ref 150–400)
RBC: 3.33 MIL/uL — ABNORMAL LOW (ref 3.87–5.11)
RDW: 18.8 % — ABNORMAL HIGH (ref 11.5–15.5)
WBC: 5.5 10*3/uL (ref 4.0–10.5)
nRBC: 0 % (ref 0.0–0.2)

## 2019-12-28 LAB — RENAL FUNCTION PANEL
Albumin: 3.1 g/dL — ABNORMAL LOW (ref 3.5–5.0)
Anion gap: 17 — ABNORMAL HIGH (ref 5–15)
BUN: 21 mg/dL (ref 8–23)
CO2: 36 mmol/L — ABNORMAL HIGH (ref 22–32)
Calcium: 9.8 mg/dL (ref 8.9–10.3)
Chloride: 88 mmol/L — ABNORMAL LOW (ref 98–111)
Creatinine, Ser: 0.72 mg/dL (ref 0.44–1.00)
GFR calc Af Amer: >60 mL/min (ref >60)
GFR calc non Af Amer: >60 mL/min (ref >60)
Glucose, Bld: 159 mg/dL — ABNORMAL HIGH (ref 70–99)
Phosphorus: 3.3 mg/dL (ref 2.5–4.6)
Potassium: 4 mmol/L (ref 3.5–5.1)
Sodium: 141 mmol/L (ref 135–145)

## 2019-12-28 LAB — MAGNESIUM: Magnesium: 2.1 mg/dL (ref 1.7–2.4)

## 2019-12-28 NOTE — Progress Notes (Addendum)
Pulmonary Critical Care Medicine [facility] [place]   PULMONARY CRITICAL CARE SERVICE  PROGRESS NOTE  Date of Service: 12/28/2019  Kelly Dawson  ZOX:[id number]  DOB: Dec 26, 1950   DOA: 12/10/2019  Referring Physician: Carron Curie, MD  HPI: Kelly Dawson is a 69 y.o. female seen for follow up of Acute on Chronic Respiratory Failure.  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  She is now better.  Cardiology did see her and recommended decreasing the clonidine.  I think ultimately she is going to need to be considered for a pacemaker as we cannot really decrease her myasthenia gravis medication in the setting of trying to wean her off the ventilator.  Medications: Reviewed on Rounds  Physical Exam:  Vitals: Temperature is 98.2 pulse 76 respiratory rate 28 blood pressure is 191/96 saturations 100%  Ventilator Settings mode ventilation pressure assist control FiO2 30% inspiratory pressure 18 PEEP 8  . General: Comfortable at this time . Eyes: Grossly normal lids, irises & conjunctiva . ENT: grossly tongue is normal . Neck: no obvious mass . Cardiovascular: S1 S2 normal no gallop . Respiratory: No rhonchi coarse breath sounds are noted . Abdomen: soft . Skin: no rash seen on limited exam . Musculoskeletal: not rigid . Psychiatric:unable to assess . Neurologic: no seizure no involuntary movements         Lab Data:   Basic Metabolic Panel: Recent Labs  Lab 12/23/19 0649 12/25/19 0622 12/26/19 1139 12/28/19 0734  NA 138 138 139 141  K 4.3 3.7 4.2 4.0  CL 90* 89* 89* 88*  CO2 38* 37* 37* 36*  GLUCOSE 144* 131* 319* 159*  BUN 25* 20 28* 21  CREATININE 0.76 0.76 1.00 0.72  CALCIUM 9.3 9.6 9.8 9.8  MG  --  2.4 2.7* 2.1  PHOS  --   --  6.5* 3.3    ABG: No results for input(s): PHART, PCO2ART, PO2ART, HCO3, O2SAT in the last 168 hours.  Liver Function Tests: Recent Labs  Lab 12/28/19 0734  ALBUMIN 3.1*   No  results for input(s): LIPASE, AMYLASE in the last 168 hours. No results for input(s): AMMONIA in the last 168 hours.  CBC: Recent Labs  Lab 12/25/19 0622 12/26/19 1139 12/28/19 0734  WBC 6.9 24.3* 5.5  HGB 9.7* 10.5* 9.1*  HCT 32.5* 36.9 31.6*  MCV 93.1 96.6 94.9  PLT 252 370 229    Cardiac Enzymes: Recent Labs  Lab 12/21/19 1936  CKTOTAL 14*  CKMB 1.2    BNP (last 3 results) No results for input(s): BNP in the last 8760 hours.  ProBNP (last 3 results) No results for input(s): PROBNP in the last 8760 hours.  Radiological Exams: No results found.  Assessment/Plan Active Problems:   Acute on chronic respiratory failure with hypoxia (HCC)   COVID-19 virus infection   Myasthenia gravis (HCC)   Multifocal pneumonia   Pneumonia due to COVID-19 virus   Critical illness polyneuropathy (HCC)   1. Acute on chronic respiratory failure with hypoxia patient right now is on the ventilator with pressure assist control FiO2 30% PEEP 8 because of the issues with the ongoing bradycardia patient's not really a candidate for weaning at this stage until the cardiac issues are resolved. 2. COVID-19 virus infection treated resolved 3. Myasthenia gravis need to continue with the [place] 4. Multifocal pneumonia treated clinically improving 5. Pneumonia due to COVID-19 treated 6. Critical illness polyneuropathy no change we will continue  to follow   I have personally seen and evaluated the patient, evaluated laboratory and imaging results, formulated the assessment and plan and placed orders. The Patient requires high complexity decision making with multiple systems involvement.  Rounds were done with the Respiratory Therapy Director and Staff therapists and discussed with nursing staff also.  Time 35 minutes  Allyne Gee, MD [facility] Pulmonary Critical Care Medicine Sleep Medicine

## 2019-12-28 NOTE — Consult Note (Signed)
Ref: McBroom, [place] S, MD   Subjective:  Awake. Has episodes of high grade AV block. Some nausea.  Objective:  Vital Signs in the last 24 hours:  T: 98.0, P:82, R:18 BP 140/70 O2 sat 98 %.  Physical Exam: BP Readings from Last 1 Encounters:  No data found for BP     Wt Readings from Last 1 Encounters:  No data found for Wt    Weight change:  There is no height or weight on file to calculate BMI. HEENT: [place]/AT, Eyes-Blue, PERL, EOMI, Conjunctiva-Pink, Sclera-Non-icteric Neck: No JVD, No bruit, Trachea midline. Lungs:  Clear, Bilateral. Cardiac:  Regular rhythm, normal S1 and S2, no S3. II/VI systolic murmur. Abdomen:  Soft, non-tender. BS present. Extremities:  No edema present. No cyanosis. No clubbing. CNS: AxOx3, Cranial nerves grossly intact, moves all 4 extremities.  Skin: Warm and dry.   Intake/Output from previous day: No intake/output data recorded.    Lab Results: BMET    Component Value Date/Time   NA 141 12/28/2019 0734   NA 139 12/26/2019 1139   NA 138 12/25/2019 0622   K 4.0 12/28/2019 0734   K 4.2 12/26/2019 1139   K 3.7 12/25/2019 0622   CL 88 (L) 12/28/2019 0734   CL 89 (L) 12/26/2019 1139   CL 89 (L) 12/25/2019 0622   CO2 36 (H) 12/28/2019 0734   CO2 37 (H) 12/26/2019 1139   CO2 37 (H) 12/25/2019 0622   GLUCOSE 159 (H) 12/28/2019 0734   GLUCOSE 319 (H) 12/26/2019 1139   GLUCOSE 131 (H) 12/25/2019 0622   BUN 21 12/28/2019 0734   BUN 28 (H) 12/26/2019 1139   BUN 20 12/25/2019 0622   CREATININE 0.72 12/28/2019 0734   CREATININE 1.00 12/26/2019 1139   CREATININE 0.76 12/25/2019 0622   CALCIUM 9.8 12/28/2019 0734   CALCIUM 9.8 12/26/2019 1139   CALCIUM 9.6 12/25/2019 0622   GFRNONAA >60 12/28/2019 0734   GFRNONAA 58 (L) 12/26/2019 1139   GFRNONAA >60 12/25/2019 0622   GFRAA >60 12/28/2019 0734   GFRAA >60 12/26/2019 1139   GFRAA >60 12/25/2019 0622   CBC    Component Value Date/Time   WBC 5.5 12/28/2019 0734   RBC 3.33 (L) 12/28/2019  0734   HGB 9.1 (L) 12/28/2019 0734   HCT 31.6 (L) 12/28/2019 0734   PLT 229 12/28/2019 0734   MCV 94.9 12/28/2019 0734   MCH 27.3 12/28/2019 0734   MCHC 28.8 (L) 12/28/2019 0734   RDW 18.8 (H) 12/28/2019 0734   LYMPHSABS 1.3 12/11/2019 0618   MONOABS 0.4 12/11/2019 0618   EOSABS 0.1 12/11/2019 0618   BASOSABS 0.0 12/11/2019 0618   HEPATIC Function Panel Recent Labs    12/11/19 0618  PROT 6.2*   HEMOGLOBIN A1C No components found for: HGA1C,  MPG CARDIAC ENZYMES Lab Results  Component Value Date   CKTOTAL 14 (L) 12/21/2019   CKMB 1.2 12/21/2019   BNP No results for input(s): PROBNP in the last 8760 hours. TSH Recent Labs    12/11/19 0618  TSH 4.042   CHOLESTEROL No results for input(s): CHOL in the last 8760 hours.  Scheduled Meds: See med list Continuous Infusions: PRN Meds:.See list  Assessment/Plan: High grade AV block Acute on chronic respiratory failure with hypoxemia S/P COVID-19 pneumonia Myasthenia Gravis Type 2 DM Polyneuropathy H/O diastolic left heart failure H/O tobacco use disorder  Decrease clonidine dosing further. OK to decrease pyridostigmine dose.   LOS: 0 days   Time spent including chart  review, lab review, examination, discussion with patient : 30 min   Orpah Cobb  MD  12/28/2019, 9:44 AM

## 2019-12-29 ENCOUNTER — Other Ambulatory Visit (HOSPITAL_COMMUNITY): Payer: Medicare Other

## 2019-12-29 DIAGNOSIS — R0603 Acute respiratory distress: Secondary | ICD-10-CM | POA: Diagnosis not present

## 2019-12-29 DIAGNOSIS — J9621 Acute and chronic respiratory failure with hypoxia: Secondary | ICD-10-CM | POA: Diagnosis not present

## 2019-12-29 DIAGNOSIS — U071 COVID-19: Secondary | ICD-10-CM | POA: Diagnosis not present

## 2019-12-29 DIAGNOSIS — G6281 Critical illness polyneuropathy: Secondary | ICD-10-CM | POA: Diagnosis not present

## 2019-12-29 LAB — CULTURE, RESPIRATORY W GRAM STAIN

## 2019-12-29 NOTE — Progress Notes (Signed)
Pulmonary Critical Care Medicine [facility] [place]   PULMONARY CRITICAL CARE SERVICE  PROGRESS NOTE  Date of Service: 12/29/2019  Kelly Dawson  NIO:[id number]  DOB: 1951-05-03   DOA: 12/10/2019  Referring Physician: Carron Curie, MD  HPI: Kelly Dawson is a 69 y.o. female seen for follow up of Acute on Chronic Respiratory Failure.  Patient continues to have episodes of bradycardia high degree of AV block again likely related to the myasthenia medications however patient should not be weaned off the myasthenia medications because of the issue with weaning the patient off the ventilator  Medications: Reviewed on Rounds  Physical Exam:  Vitals: Temperature is 97.4 pulse 78 respiratory 21 blood pressure is 120/68 saturations 100%  Ventilator Settings on pressure assist control FiO2 30% inspiratory pressures 18 PEEP 5  . General: Comfortable at this time . Eyes: Grossly normal lids, irises & conjunctiva . ENT: grossly tongue is normal . Neck: no obvious mass . Cardiovascular: S1 S2 normal no gallop . Respiratory: Coarse breath sounds few rhonchi . Abdomen: soft . Skin: no rash seen on limited exam . Musculoskeletal: not rigid . Psychiatric:unable to assess . Neurologic: no seizure no involuntary movements         Lab Data:   Basic Metabolic Panel: Recent Labs  Lab 12/23/19 0649 12/25/19 0622 12/26/19 1139 12/28/19 0734  NA 138 138 139 141  K 4.3 3.7 4.2 4.0  CL 90* 89* 89* 88*  CO2 38* 37* 37* 36*  GLUCOSE 144* 131* 319* 159*  BUN 25* 20 28* 21  CREATININE 0.76 0.76 1.00 0.72  CALCIUM 9.3 9.6 9.8 9.8  MG  --  2.4 2.7* 2.1  PHOS  --   --  6.5* 3.3    ABG: No results for input(s): PHART, PCO2ART, PO2ART, HCO3, O2SAT in the last 168 hours.  Liver Function Tests: Recent Labs  Lab 12/28/19 0734  ALBUMIN 3.1*   No results for input(s): LIPASE, AMYLASE in the last 168 hours. No results for input(s): AMMONIA in the last 168 hours.  CBC: Recent  Labs  Lab 12/25/19 0622 12/26/19 1139 12/28/19 0734  WBC 6.9 24.3* 5.5  HGB 9.7* 10.5* 9.1*  HCT 32.5* 36.9 31.6*  MCV 93.1 96.6 94.9  PLT 252 370 229    Cardiac Enzymes: No results for input(s): CKTOTAL, CKMB, CKMBINDEX, TROPONINI in the last 168 hours.  BNP (last 3 results) No results for input(s): BNP in the last 8760 hours.  ProBNP (last 3 results) No results for input(s): PROBNP in the last 8760 hours.  Radiological Exams: No results found.  Assessment/Plan Active Problems:   Acute on chronic respiratory failure with hypoxia (HCC)   COVID-19 virus infection   Myasthenia gravis (HCC)   Multifocal pneumonia   Pneumonia due to COVID-19 virus   Critical illness polyneuropathy (HCC)   1. Acute on chronic respiratory failure hypoxia patient remains on the ventilator on full support has been  failing attempts at weaning.  If the Mestinon is wean down we may have a further increase difficulty weaning the patient T collar. 2. COVID-19 virus infection in resolution phase 3. Myasthenia gravis advanced disease 4. Multifocal pneumonia treated 5. Bradycardia patient is having high degree AV block's 6. Critical illness polyneuropathy no changes 7. Pneumonia due to COVID-19 has been treated we will continue to follow   I have personally seen and evaluated the patient, evaluated laboratory and imaging results, formulated the assessment and plan and placed orders. The Patient requires high complexity  decision making with multiple systems involvement.  Rounds were done with the Respiratory Therapy Director and Staff therapists and discussed with nursing staff also.  Patient is critically ill in danger of cardiac arrest and death time 49 minutes  Allyne Gee, MD [facility] Pulmonary Critical Care Medicine Sleep Medicine

## 2019-12-29 NOTE — Consult Note (Signed)
Ref: McBroom, [place] S, MD   Subjective:  Awake and sitting up. Chronic nausea. Episodes of high grade AV block continues.  Objective:  Vital Signs in the last 24 hours:    Physical Exam: BP Readings from Last 1 Encounters:  No data found for BP     Wt Readings from Last 1 Encounters:  No data found for Wt    Weight change:  There is no height or weight on file to calculate BMI. HEENT: [place]/AT, Eyes-Blue, PERL, EOMI, Conjunctiva-Pink, Sclera-Non-icteric Neck: No JVD, No bruit, Trachea midline. Lungs:  Clear, Bilateral. Cardiac:  Regular rhythm, normal S1 and S2, no S3. II/VI systolic murmur. Abdomen:  Soft, mildly -tender. BS present. Extremities:  No edema present. No cyanosis. No clubbing. CNS: AxOx3, Cranial nerves grossly intact, moves all 4 extremities.  Skin: Warm and dry.   Intake/Output from previous day: No intake/output data recorded.    Lab Results: BMET    Component Value Date/Time   NA 141 12/28/2019 0734   NA 139 12/26/2019 1139   NA 138 12/25/2019 0622   K 4.0 12/28/2019 0734   K 4.2 12/26/2019 1139   K 3.7 12/25/2019 0622   CL 88 (L) 12/28/2019 0734   CL 89 (L) 12/26/2019 1139   CL 89 (L) 12/25/2019 0622   CO2 36 (H) 12/28/2019 0734   CO2 37 (H) 12/26/2019 1139   CO2 37 (H) 12/25/2019 0622   GLUCOSE 159 (H) 12/28/2019 0734   GLUCOSE 319 (H) 12/26/2019 1139   GLUCOSE 131 (H) 12/25/2019 0622   BUN 21 12/28/2019 0734   BUN 28 (H) 12/26/2019 1139   BUN 20 12/25/2019 0622   CREATININE 0.72 12/28/2019 0734   CREATININE 1.00 12/26/2019 1139   CREATININE 0.76 12/25/2019 0622   CALCIUM 9.8 12/28/2019 0734   CALCIUM 9.8 12/26/2019 1139   CALCIUM 9.6 12/25/2019 0622   GFRNONAA >60 12/28/2019 0734   GFRNONAA 58 (L) 12/26/2019 1139   GFRNONAA >60 12/25/2019 0622   GFRAA >60 12/28/2019 0734   GFRAA >60 12/26/2019 1139   GFRAA >60 12/25/2019 0622   CBC    Component Value Date/Time   WBC 5.5 12/28/2019 0734   RBC 3.33 (L) 12/28/2019 0734   HGB  9.1 (L) 12/28/2019 0734   HCT 31.6 (L) 12/28/2019 0734   PLT 229 12/28/2019 0734   MCV 94.9 12/28/2019 0734   MCH 27.3 12/28/2019 0734   MCHC 28.8 (L) 12/28/2019 0734   RDW 18.8 (H) 12/28/2019 0734   LYMPHSABS 1.3 12/11/2019 0618   MONOABS 0.4 12/11/2019 0618   EOSABS 0.1 12/11/2019 0618   BASOSABS 0.0 12/11/2019 0618   HEPATIC Function Panel Recent Labs    12/11/19 0618  PROT 6.2*   HEMOGLOBIN A1C No components found for: HGA1C,  MPG CARDIAC ENZYMES Lab Results  Component Value Date   CKTOTAL 14 (L) 12/21/2019   CKMB 1.2 12/21/2019   BNP No results for input(s): PROBNP in the last 8760 hours. TSH Recent Labs    12/11/19 0618  TSH 4.042   CHOLESTEROL No results for input(s): CHOL in the last 8760 hours.  Scheduled Meds: Continuous Infusions: PRN Meds:.  Assessment/Plan: Paroxysmal high grade AV block Acute on chronic respiratory failure with hypoxemia S/P COVID-19 pneumonia Myasthenia Gravis Type 2 DM Polyneuropathy H/O diastolic left heart failure H/O tobacco use disorder  Decrease Clonidine dose by 50 % and decrease pyridostigmine by 33 %.   LOS: 0 days   Time spent including chart review, lab review, examination, discussion  with patient and nurse : 30 min   Dixie Dials  MD  12/29/2019, 10:53 AM

## 2019-12-29 NOTE — Progress Notes (Signed)
PROGRESS NOTE    ANETHA SLAGEL  IRS:[id number] DOB: 1951-02-26 DOA: 12/10/2019   Brief Narrative:  Kelly Dawson is an 69 y.o. female with pleasantly delusional myasthenia gravis with a trilogy vent use at night, diabetes mellitus, hypertension, pulmonary hypertension, history of congestive heart failure, COPD, history of acute kidney injury requiring short-term dialysis, depression was admitted to the outside hospital with complaints of hypoxemia, fever, shortness of breath.  She was initially diagnosed with COVID-19 pneumonia on 10/13/2020 prior to this admission.  She had a prolonged course in the hospital in the setting of COVID-19 infection with myasthenia gravis.  She was intubated twice.  She continued to have respiratory decompensation and eventually received tracheostomy on 11/20/2019.  She also had pneumonia with MRSA and was treated with IV vancomycin.  She also received treatment with Decadron, remdesivir, convalescent plasma for COVID-19 infection.  She underwent PEG tube placement by IR on 12/06/2019.  Due to her complex medical problems she was transferred to [facility] for further management and care. She had episodes of bradycardia and was seen by cardiology.  Her beta-blocker was discontinued.  Her clonidine dose was decreased and hydralazine dose increased.  Scopolamine was also held.  Respiratory cultures from here that were collected on 12/17/2019 showed moderate staph aureus, resistant to oxacillin.  She received treatment with IV vancomycin.  Now switched to Zyvox.  Cefepime added for gram-negative coverage.  Started on p.o. vancomycin for diarrhea.  She continues to have trach in place. She is saying the diarrhea is improving. Denies having any abdominal pain.    Assessment & Plan:   Active Problems:   Acute on chronic respiratory failure with hypoxia (HCC)   COVID-19 virus infection   Myasthenia gravis (HCC)   Multifocal pneumonia   Pneumonia due to COVID-19  virus   Critical illness polyneuropathy (HCC) Pneumonia with MRSA Bradycardia Diabetes mellitus type 1 Anemia Dysphagia Protein calorie malnutrition Diarrhea Obesity  Acute on chronic respiratory failure with hypoxemia: Multifactorial.  She had COVID-19 infection with pneumonia.  Subsequently had pneumonia with MRSA.  She received treatment with IV vancomycin.  However, continues to complain of symptoms while on IV vancomycin.  Also patient is obese and requiring high dose of vancomycin to maintain the trough and she is at high risk for renal failure.  Therefore changed to Zyvox.  Cefepime added for gram-negative coverage.  We will plan to treat for tentative duration of 1 week.  Pulmonary following.   However, she has myasthenia gravis and dysphagia and high risk for aspiration and worsening respiratory failure secondary to aspiration pneumonia.  If her respiratory status worsens would recommend to repeat respiratory cultures also repeat chest imaging preferably chest CT without contrast to better evaluate.  Pneumonia: Respiratory cultures showing MRSA.  She already was treated with IV vancomycin at the outside facility for MRSA pneumonia.  Respiratory cultures that were repeated from your also showing staph aureus which is resistant to oxacillin.  Has been on treatment with IV vancomycin.  However, patient requiring high dose of vancomycin and she is high risk for developing renal insufficiency from the high dose of IV vancomycin.    Therefore switched to Zyvox, cefepime added for gram-negative coverage.   Plan to treat for tentative duration of 7 days pending improvement.  However, as mentioned above she is very high risk for recurrent pneumonia, worsening respiratory failure secondary to dysphagia, risk of aspiration.    Diarrhea: She is complaining of loose stools. On p.o. vancomycin for  diarrhea.  Improving per patient. Dietitian following the patient and patient was started on fiber  supplement.  Stool softeners are on hold.  Bradycardia: Patient had episodes of bradycardia thought to be secondary to her labetalol which has been held.  Cardiology following.  Medications adjusted per cardiology. Continue to monitor on telemetry.  Myasthenia gravis/critical illness myopathy: She is on prednisone and Mestinon.  She is status post IVIG on December 8.  Continue supportive management per primary team.  Follow-up with neurology on discharge.  Dysphagia: Unfortunately due to her dysphagia she is high risk for aspiration and worsening respiratory failure secondary to aspiration pneumonia.  Diabetes mellitus: Continue medications and management per the primary team.  Anemia: She is status post transfusion.  Continue to monitor hemoglobin, further work-up and management per primary team.  Protein calorie malnutrition: Management per primary team.  Unfortunately due to her complex medical problems she is very high risk for worsening and decompensation.  Plan of care discussed with the primary team.  Subjective: She is still complaining of some shortness of breath.  She says she had 2 loose stools today.  Denies having abdominal pain.  Objective: Vitals: Temperature 98.1, pulse 86, respiratory rate 27, blood pressure 178/76, pulse oximetry 98%, on 30% FiO2 with PEEP of 5.  Examination:  General:  Chronically ill-appearing female, awake, not in any acute distress at this time Eyes: PERLA, EOMI, irises appear normal, anicteric sclera  ENMT: external ears and nose appear normal, Lips appears normal   Neck: Has trach in place CVS: S1-S2, no murmur Respiratory: Decreased at the lower lobes, rhonchi, no wheezing Abdomen: soft nontender, nondistended, normal bowel sounds Musculoskeletal: No edema Neuro: She has debility with generalized weakness Psych:stable mood and affect, mental status Skin: no rashes    Data Reviewed: I have personally reviewed following labs and  imaging studies  CBC: Recent Labs  Lab 12/25/19 0622 12/26/19 1139 12/28/19 0734  WBC 6.9 24.3* 5.5  HGB 9.7* 10.5* 9.1*  HCT 32.5* 36.9 31.6*  MCV 93.1 96.6 94.9  PLT 252 370 229   Basic Metabolic Panel: Recent Labs  Lab 12/23/19 0649 12/25/19 0622 12/26/19 1139 12/28/19 0734  NA 138 138 139 141  K 4.3 3.7 4.2 4.0  CL 90* 89* 89* 88*  CO2 38* 37* 37* 36*  GLUCOSE 144* 131* 319* 159*  BUN 25* 20 28* 21  CREATININE 0.76 0.76 1.00 0.72  CALCIUM 9.3 9.6 9.8 9.8  MG  --  2.4 2.7* 2.1  PHOS  --   --  6.5* 3.3   GFR: CrCl cannot be calculated (Unknown ideal weight.). Liver Function Tests: Recent Labs  Lab 12/28/19 0734  ALBUMIN 3.1*   No results for input(s): LIPASE, AMYLASE in the last 168 hours. No results for input(s): AMMONIA in the last 168 hours. Coagulation Profile: No results for input(s): INR, PROTIME in the last 168 hours. Cardiac Enzymes: No results for input(s): CKTOTAL, CKMB, CKMBINDEX, TROPONINI in the last 168 hours. BNP (last 3 results) No results for input(s): PROBNP in the last 8760 hours. HbA1C: No results for input(s): HGBA1C in the last 72 hours. CBG: No results for input(s): GLUCAP in the last 168 hours. Lipid Profile: No results for input(s): CHOL, HDL, LDLCALC, TRIG, CHOLHDL, LDLDIRECT in the last 72 hours. Thyroid Function Tests: No results for input(s): TSH, T4TOTAL, FREET4, T3FREE, THYROIDAB in the last 72 hours. Anemia Panel: No results for input(s): VITAMINB12, FOLATE, FERRITIN, TIBC, IRON, RETICCTPCT in the last 72 hours. Sepsis Labs: No  results for input(s): PROCALCITON, LATICACIDVEN in the last 168 hours.  Recent Results (from the past 240 hour(s))  C difficile quick scan w PCR reflex     Status: None   Collection Time: 12/24/19  9:31 AM   Specimen: STOOL  Result Value Ref Range Status   C Diff antigen NEGATIVE NEGATIVE Final   C Diff toxin NEGATIVE NEGATIVE Final   C Diff interpretation No C. difficile detected.  Final     Comment: Performed at [facility], [place] [address] [place], [place] [address]  Culture, Urine     Status: None   Collection Time: 12/26/19  1:50 PM   Specimen: Urine, Random  Result Value Ref Range Status   Specimen Description URINE, RANDOM  Final   Special Requests NONE  Final   Culture   Final    NO GROWTH Performed at [facility], [place] [address], [place], [place] [address]    Report Status 12/27/2019 FINAL  Final  Culture, blood (routine x 2)     Status: None (Preliminary result)   Collection Time: 12/26/19  1:57 PM   Specimen: BLOOD  Result Value Ref Range Status   Specimen Description BLOOD RIGHT ANTECUBITAL  Final   Special Requests   Final    BOTTLES DRAWN AEROBIC AND ANAEROBIC Blood Culture adequate volume   Culture   Final    NO GROWTH 3 DAYS Performed at [facility], [place] [address] [place], [place] [address]    Report Status PENDING  Incomplete  Culture, blood (routine x 2)     Status: None (Preliminary result)   Collection Time: 12/26/19  2:03 PM   Specimen: BLOOD  Result Value Ref Range Status   Specimen Description BLOOD LEFT ANTECUBITAL  Final   Special Requests   Final    BOTTLES DRAWN AEROBIC AND ANAEROBIC Blood Culture adequate volume   Culture   Final    NO GROWTH 3 DAYS Performed at [facility], [place] [address], [place], [place] [address]    Report Status PENDING  Incomplete  Culture, respiratory (non-expectorated)     Status: None   Collection Time: 12/26/19  4:25 PM   Specimen: Tracheal Aspirate; Respiratory  Result Value Ref Range Status   Specimen Description TRACHEAL ASPIRATE  Final   Special Requests NONE  Final   Gram Stain   Final    MODERATE WBC PRESENT, PREDOMINANTLY PMN RARE SQUAMOUS EPITHELIAL CELLS PRESENT ABUNDANT GRAM POSITIVE COCCI RARE GRAM NEGATIVE RODS RARE GRAM POSITIVE RODS Performed at [facility], [place] [address] [place],  [address]    Culture   Final    MODERATE METHICILLIN  RESISTANT STAPHYLOCOCCUS AUREUS   Report Status 12/29/2019 FINAL  Final   Organism ID, Bacteria METHICILLIN RESISTANT STAPHYLOCOCCUS AUREUS  Final      Susceptibility   Methicillin resistant staphylococcus aureus - MIC*    CIPROFLOXACIN <=0.5 SENSITIVE Sensitive     ERYTHROMYCIN >=8 RESISTANT Resistant     GENTAMICIN <=0.5 SENSITIVE Sensitive     OXACILLIN >=4 RESISTANT Resistant     TETRACYCLINE <=1 SENSITIVE Sensitive     VANCOMYCIN 1 SENSITIVE Sensitive     TRIMETH/SULFA <=10 SENSITIVE Sensitive     CLINDAMYCIN <=0.25 SENSITIVE Sensitive     RIFAMPIN <=0.5 SENSITIVE Sensitive     Inducible Clindamycin NEGATIVE Sensitive     * MODERATE METHICILLIN RESISTANT STAPHYLOCOCCUS AUREUS         Radiology Studies: DG Abd Portable  1V  Result Date: 12/29/2019 CLINICAL DATA:  Possible ileus. EXAM: PORTABLE ABDOMEN - 1 VIEW COMPARISON:  December 10, 2019. FINDINGS: The bowel gas pattern is normal. No radio-opaque calculi or other significant radiographic abnormality are seen. Gastrostomy tube is noted in grossly good position. IMPRESSION: No evidence of bowel obstruction or ileus. Electronically Signed   By: Lupita Raider M.D.   On: 12/29/2019 11:20        Scheduled Meds: Please see MAR   Vonzella Nipple, MD  12/29/2019, 6:42 PM

## 2019-12-30 ENCOUNTER — Other Ambulatory Visit (HOSPITAL_COMMUNITY): Payer: Medicare Other

## 2019-12-30 DIAGNOSIS — G6281 Critical illness polyneuropathy: Secondary | ICD-10-CM | POA: Diagnosis not present

## 2019-12-30 DIAGNOSIS — J189 Pneumonia, unspecified organism: Secondary | ICD-10-CM | POA: Diagnosis not present

## 2019-12-30 DIAGNOSIS — U071 COVID-19: Secondary | ICD-10-CM | POA: Diagnosis not present

## 2019-12-30 DIAGNOSIS — J9621 Acute and chronic respiratory failure with hypoxia: Secondary | ICD-10-CM | POA: Diagnosis not present

## 2019-12-30 LAB — PHOSPHORUS: Phosphorus: 4.1 mg/dL (ref 2.5–4.6)

## 2019-12-30 LAB — BASIC METABOLIC PANEL
Anion gap: 11 (ref 5–15)
BUN: 24 mg/dL — ABNORMAL HIGH (ref 8–23)
CO2: 36 mmol/L — ABNORMAL HIGH (ref 22–32)
Calcium: 9.9 mg/dL (ref 8.9–10.3)
Chloride: 89 mmol/L — ABNORMAL LOW (ref 98–111)
Creatinine, Ser: 0.76 mg/dL (ref 0.44–1.00)
GFR calc Af Amer: >60 mL/min (ref >60)
GFR calc non Af Amer: >60 mL/min (ref >60)
Glucose, Bld: 156 mg/dL — ABNORMAL HIGH (ref 70–99)
Potassium: 3.6 mmol/L (ref 3.5–5.1)
Sodium: 136 mmol/L (ref 135–145)

## 2019-12-30 LAB — CBC
HCT: 34.1 % — ABNORMAL LOW (ref 36.0–46.0)
Hemoglobin: 10 g/dL — ABNORMAL LOW (ref 12.0–15.0)
MCH: 27.6 pg (ref 26.0–34.0)
MCHC: 29.3 g/dL — ABNORMAL LOW (ref 30.0–36.0)
MCV: 94.2 fL (ref 80.0–100.0)
Platelets: 242 10*3/uL (ref 150–400)
RBC: 3.62 MIL/uL — ABNORMAL LOW (ref 3.87–5.11)
RDW: 18.6 % — ABNORMAL HIGH (ref 11.5–15.5)
WBC: 6.4 10*3/uL (ref 4.0–10.5)
nRBC: 0 % (ref 0.0–0.2)

## 2019-12-30 LAB — MAGNESIUM: Magnesium: 2.2 mg/dL (ref 1.7–2.4)

## 2019-12-30 NOTE — Progress Notes (Signed)
Pulmonary Critical Care Medicine [facility] [place]   PULMONARY CRITICAL CARE SERVICE  PROGRESS NOTE  Date of Service: 12/30/2019  Kelly Dawson  ZDG:[id number]  DOB: 1951-07-30   DOA: 12/10/2019  Referring Physician: Carron Curie, MD  HPI: Kelly Dawson is a 69 y.o. female seen for follow up of Acute on Chronic Respiratory Failure.  Patient is currently on full support on pressure control mode no bradycardia noted this morning  Medications: Reviewed on Rounds  Physical Exam:  Vitals: Temperature 98.4 pulse 92 respiratory 22 blood pressure is 169/73 saturations 99%  Ventilator Settings mode ventilation pressure assist control FiO2 30% inspiratory pressures 18 PEEP 5  . General: Comfortable at this time . Eyes: Grossly normal lids, irises & conjunctiva . ENT: grossly tongue is normal . Neck: no obvious mass . Cardiovascular: S1 S2 normal no gallop . Respiratory: Coarse breath sounds with a few scattered rhonchi . Abdomen: soft . Skin: no rash seen on limited exam . Musculoskeletal: not rigid . Psychiatric:unable to assess . Neurologic: no seizure no involuntary movements         Lab Data:   Basic Metabolic Panel: Recent Labs  Lab 12/25/19 0622 12/26/19 1139 12/28/19 0734 12/30/19 0556  NA 138 139 141 136  K 3.7 4.2 4.0 3.6  CL 89* 89* 88* 89*  CO2 37* 37* 36* 36*  GLUCOSE 131* 319* 159* 156*  BUN 20 28* 21 24*  CREATININE 0.76 1.00 0.72 0.76  CALCIUM 9.6 9.8 9.8 9.9  MG 2.4 2.7* 2.1 2.2  PHOS  --  6.5* 3.3 4.1    ABG: No results for input(s): PHART, PCO2ART, PO2ART, HCO3, O2SAT in the last 168 hours.  Liver Function Tests: Recent Labs  Lab 12/28/19 0734  ALBUMIN 3.1*   No results for input(s): LIPASE, AMYLASE in the last 168 hours. No results for input(s): AMMONIA in the last 168 hours.  CBC: Recent Labs  Lab 12/25/19 0622 12/26/19 1139 12/28/19 0734 12/30/19 0556  WBC 6.9 24.3* 5.5 6.4  HGB 9.7* 10.5* 9.1* 10.0*  HCT 32.5* 36.9  31.6* 34.1*  MCV 93.1 96.6 94.9 94.2  PLT 252 370 229 242    Cardiac Enzymes: No results for input(s): CKTOTAL, CKMB, CKMBINDEX, TROPONINI in the last 168 hours.  BNP (last 3 results) No results for input(s): BNP in the last 8760 hours.  ProBNP (last 3 results) No results for input(s): PROBNP in the last 8760 hours.  Radiological Exams: DG CHEST PORT 1 VIEW  Result Date: 12/30/2019 CLINICAL DATA:  Cough and shortness of breath. EXAM: PORTABLE CHEST 1 VIEW COMPARISON:  12/24/2019 FINDINGS: The tracheostomy tube is in good position. The heart is within normal limits in size. Stable tortuosity and calcification of the thoracic aorta, advanced for age. Chronic underlying lung disease with superimposed patchy bilateral infiltrates. Persistent small effusions. IMPRESSION: Chronic underlying lung disease with superimposed new patchy bilateral infiltrates and small effusions. Electronically Signed   By: Rudie Meyer M.D.   On: 12/30/2019 06:13   DG Abd Portable 1V  Result Date: 12/29/2019 CLINICAL DATA:  Possible ileus. EXAM: PORTABLE ABDOMEN - 1 VIEW COMPARISON:  December 10, 2019. FINDINGS: The bowel gas pattern is normal. No radio-opaque calculi or other significant radiographic abnormality are seen. Gastrostomy tube is noted in grossly good position. IMPRESSION: No evidence of bowel obstruction or ileus. Electronically Signed   By: Lupita Raider M.D.   On: 12/29/2019 11:20    Assessment/Plan Active Problems:   Acute on chronic respiratory  failure with hypoxia ([place])   COVID-19 virus infection   Myasthenia gravis ([place])   Multifocal pneumonia   Pneumonia due to COVID-19 virus   Critical illness polyneuropathy ([place])   1. Acute on chronic respiratory failure with hypoxia plan is to continue with full support on pressure control mode currently on 30% FiO2 patient's PEEP is at 5 she has not been tolerating any weaning attempts. 2. COVID-19 virus infection treated we will continue with  supportive care 3. Myasthenia gravis at baseline 4. Multifocal pneumonia no change 5. COVID-19 pneumonia treated improving 6. Critical illness neuropathy no change we will continue with supportive care   I have personally seen and evaluated the patient, evaluated laboratory and imaging results, formulated the assessment and plan and placed orders. The Patient requires high complexity decision making with multiple systems involvement.  Rounds were done with the Respiratory Therapy Director and Staff therapists and discussed with nursing staff also.  Allyne Gee, MD [facility] Pulmonary Critical Care Medicine Sleep Medicine

## 2019-12-30 NOTE — Consult Note (Signed)
Ref: McBroom, [place] S, MD   Subjective:  Patient feels less nausea and has no episodes of bradycardia but now HR is inching up in 90's and BP is slightly high.  Objective:  Vital Signs in the last 24 hours:  T: 98.4, P:92, R: 22, BP: 169/73, O2 sat 99 %.  Physical Exam: BP Readings from Last 1 Encounters:  No data found for BP     Wt Readings from Last 1 Encounters:  No data found for Wt    Weight change:  There is no height or weight on file to calculate BMI. HEENT: [place]/AT, Eyes-Blue, PERL, EOMI, Conjunctiva-Pink, Sclera-Non-icteric Neck: No JVD, No bruit, Trachea midline. Lungs:  Clearing, Bilateral. Cardiac:  Regular rhythm, normal S1 and S2, no S3. II/VI systolic murmur. Abdomen:  Soft, non-tender. BS present. Extremities:  No edema present. No cyanosis. No clubbing. CNS: AxOx3, Cranial nerves grossly intact, moves all 4 extremities.  Skin: Warm and dry.   Intake/Output from previous day: No intake/output data recorded.    Lab Results: BMET    Component Value Date/Time   NA 136 12/30/2019 0556   NA 141 12/28/2019 0734   NA 139 12/26/2019 1139   K 3.6 12/30/2019 0556   K 4.0 12/28/2019 0734   K 4.2 12/26/2019 1139   CL 89 (L) 12/30/2019 0556   CL 88 (L) 12/28/2019 0734   CL 89 (L) 12/26/2019 1139   CO2 36 (H) 12/30/2019 0556   CO2 36 (H) 12/28/2019 0734   CO2 37 (H) 12/26/2019 1139   GLUCOSE 156 (H) 12/30/2019 0556   GLUCOSE 159 (H) 12/28/2019 0734   GLUCOSE 319 (H) 12/26/2019 1139   BUN 24 (H) 12/30/2019 0556   BUN 21 12/28/2019 0734   BUN 28 (H) 12/26/2019 1139   CREATININE 0.76 12/30/2019 0556   CREATININE 0.72 12/28/2019 0734   CREATININE 1.00 12/26/2019 1139   CALCIUM 9.9 12/30/2019 0556   CALCIUM 9.8 12/28/2019 0734   CALCIUM 9.8 12/26/2019 1139   GFRNONAA >60 12/30/2019 0556   GFRNONAA >60 12/28/2019 0734   GFRNONAA 58 (L) 12/26/2019 1139   GFRAA >60 12/30/2019 0556   GFRAA >60 12/28/2019 0734   GFRAA >60 12/26/2019 1139   CBC     Component Value Date/Time   WBC 6.4 12/30/2019 0556   RBC 3.62 (L) 12/30/2019 0556   HGB 10.0 (L) 12/30/2019 0556   HCT 34.1 (L) 12/30/2019 0556   PLT 242 12/30/2019 0556   MCV 94.2 12/30/2019 0556   MCH 27.6 12/30/2019 0556   MCHC 29.3 (L) 12/30/2019 0556   RDW 18.6 (H) 12/30/2019 0556   LYMPHSABS 1.3 12/11/2019 0618   MONOABS 0.4 12/11/2019 0618   EOSABS 0.1 12/11/2019 0618   BASOSABS 0.0 12/11/2019 0618   HEPATIC Function Panel Recent Labs    12/11/19 0618  PROT 6.2*   HEMOGLOBIN A1C No components found for: HGA1C,  MPG CARDIAC ENZYMES Lab Results  Component Value Date   CKTOTAL 14 (L) 12/21/2019   CKMB 1.2 12/21/2019   BNP No results for input(s): PROBNP in the last 8760 hours. TSH Recent Labs    12/11/19 0618  TSH 4.042   CHOLESTEROL No results for input(s): CHOL in the last 8760 hours.  Scheduled Meds:See list Continuous Infusions: PRN Meds:.  Assessment/Plan: Paroxysmal high grade AV block Acute on chronic respiratory failure with hypoxemia Hypertension S/P COVID-19 pneumonia Myasthenia Gravis Type 2 DM Polyneuropathy H/O diastolic left heart failure H/O tobacco use disorder  Increase Clonidine to 0.1 mg. twice  daily for BP control-Still 75 % lower dose than her previous dose. Add very small dose of metoprolol 12.5 mg. q 6 hour for heart rate control.   LOS: 0 days   Time spent including chart review, lab review, examination, discussion with patient and nurse/tech : 30 min   Orpah Cobb  MD  12/30/2019, 10:46 AM

## 2019-12-31 DIAGNOSIS — U071 COVID-19: Secondary | ICD-10-CM | POA: Diagnosis not present

## 2019-12-31 DIAGNOSIS — J189 Pneumonia, unspecified organism: Secondary | ICD-10-CM | POA: Diagnosis not present

## 2019-12-31 DIAGNOSIS — G6281 Critical illness polyneuropathy: Secondary | ICD-10-CM | POA: Diagnosis not present

## 2019-12-31 DIAGNOSIS — J9621 Acute and chronic respiratory failure with hypoxia: Secondary | ICD-10-CM | POA: Diagnosis not present

## 2019-12-31 LAB — CULTURE, BLOOD (ROUTINE X 2)
Culture: NO GROWTH
Culture: NO GROWTH
Special Requests: ADEQUATE
Special Requests: ADEQUATE

## 2019-12-31 NOTE — Progress Notes (Addendum)
Pulmonary Critical Care Medicine [facility] [place]   PULMONARY CRITICAL CARE SERVICE  PROGRESS NOTE  Date of Service: 12/31/2019  UGOCHI HENZLER  DGU:[id number]  DOB: [birth year]   DOA: 12/10/2019  Referring Physician: Carron Curie, MD  HPI: AYAN YANKEY is a 69 y.o. female seen for follow up of Acute on Chronic Respiratory Failure.  Patient remains on full support pressure control mode rate 15 with FiO2 30% currently satting well no distress.  Medications: Reviewed on Rounds  Physical Exam:  Vitals: Pulse 93 respirations 22 BP 106/76 O2 sat 100% temp 98.1  Ventilator Settings pressure control rate of 15 expiratory pressure of 18 PEEP of 5 and FiO2 30%  . General: Comfortable at this time . Eyes: Grossly normal lids, irises & conjunctiva . ENT: grossly tongue is normal . Neck: no obvious mass . Cardiovascular: S1 S2 normal no gallop . Respiratory: No rales or rhonchi noted . Abdomen: soft . Skin: no rash seen on limited exam . Musculoskeletal: not rigid . Psychiatric:unable to assess . Neurologic: no seizure no involuntary movements         Lab Data:   Basic Metabolic Panel: Recent Labs  Lab 12/25/19 0622 12/26/19 1139 12/28/19 0734 12/30/19 0556  NA 138 139 141 136  K 3.7 4.2 4.0 3.6  CL 89* 89* 88* 89*  CO2 37* 37* 36* 36*  GLUCOSE 131* 319* 159* 156*  BUN 20 28* 21 24*  CREATININE 0.76 1.00 0.72 0.76  CALCIUM 9.6 9.8 9.8 9.9  MG 2.4 2.7* 2.1 2.2  PHOS  --  6.5* 3.3 4.1    ABG: No results for input(s): PHART, PCO2ART, PO2ART, HCO3, O2SAT in the last 168 hours.  Liver Function Tests: Recent Labs  Lab 12/28/19 0734  ALBUMIN 3.1*   No results for input(s): LIPASE, AMYLASE in the last 168 hours. No results for input(s): AMMONIA in the last 168 hours.  CBC: Recent Labs  Lab 12/25/19 0622 12/26/19 1139 12/28/19 0734 12/30/19 0556  WBC 6.9 24.3* 5.5 6.4  HGB 9.7* 10.5* 9.1* 10.0*  HCT 32.5* 36.9 31.6* 34.1*  MCV 93.1 96.6 94.9 94.2   PLT 252 370 229 242    Cardiac Enzymes: No results for input(s): CKTOTAL, CKMB, CKMBINDEX, TROPONINI in the last 168 hours.  BNP (last 3 results) No results for input(s): BNP in the last 8760 hours.  ProBNP (last 3 results) No results for input(s): PROBNP in the last 8760 hours.  Radiological Exams: DG CHEST PORT 1 VIEW  Result Date: 12/30/2019 CLINICAL DATA:  Cough and shortness of breath. EXAM: PORTABLE CHEST 1 VIEW COMPARISON:  12/24/2019 FINDINGS: The tracheostomy tube is in good position. The heart is within normal limits in size. Stable tortuosity and calcification of the thoracic aorta, advanced for age. Chronic underlying lung disease with superimposed patchy bilateral infiltrates. Persistent small effusions. IMPRESSION: Chronic underlying lung disease with superimposed new patchy bilateral infiltrates and small effusions. Electronically Signed   By: Rudie Meyer M.D.   On: 12/30/2019 06:13    Assessment/Plan Active Problems:   Acute on chronic respiratory failure with hypoxia (HCC)   COVID-19 virus infection   Myasthenia gravis (HCC)   Multifocal pneumonia   Pneumonia due to COVID-19 virus   Critical illness polyneuropathy (HCC)   1. Acute on chronic respiratory failure with hypoxia patient will continue on pressure control at this time settings noted above.  Continue supportive measures and pulmonary toilet continue to wean patient tolerated. 2. COVID-19 virus infection treated we will  continue with supportive care 3. Myasthenia gravis at baseline 4. Multifocal pneumonia no change 5. COVID-19 pneumonia treated improving 6. Critical illness neuropathy no change we will continue with supportive care   I have personally seen and evaluated the patient, evaluated laboratory and imaging results, formulated the assessment and plan and placed orders. The Patient requires high complexity decision making with multiple systems involvement.  Rounds were done with the Respiratory  Therapy Director and Staff therapists and discussed with nursing staff also.  Allyne Gee, MD [facility] Pulmonary Critical Care Medicine Sleep Medicine

## 2020-01-01 DIAGNOSIS — G6281 Critical illness polyneuropathy: Secondary | ICD-10-CM | POA: Diagnosis not present

## 2020-01-01 DIAGNOSIS — U071 COVID-19: Secondary | ICD-10-CM | POA: Diagnosis not present

## 2020-01-01 DIAGNOSIS — J9621 Acute and chronic respiratory failure with hypoxia: Secondary | ICD-10-CM | POA: Diagnosis not present

## 2020-01-01 DIAGNOSIS — J189 Pneumonia, unspecified organism: Secondary | ICD-10-CM | POA: Diagnosis not present

## 2020-01-01 LAB — CBC
HCT: 33.5 % — ABNORMAL LOW (ref 36.0–46.0)
Hemoglobin: 9.7 g/dL — ABNORMAL LOW (ref 12.0–15.0)
MCH: 27.2 pg (ref 26.0–34.0)
MCHC: 29 g/dL — ABNORMAL LOW (ref 30.0–36.0)
MCV: 93.8 fL (ref 80.0–100.0)
Platelets: 227 10*3/uL (ref 150–400)
RBC: 3.57 MIL/uL — ABNORMAL LOW (ref 3.87–5.11)
RDW: 18 % — ABNORMAL HIGH (ref 11.5–15.5)
WBC: 4.2 10*3/uL (ref 4.0–10.5)
nRBC: 0 % (ref 0.0–0.2)

## 2020-01-01 LAB — BASIC METABOLIC PANEL
Anion gap: 9 (ref 5–15)
BUN: 28 mg/dL — ABNORMAL HIGH (ref 8–23)
CO2: 39 mmol/L — ABNORMAL HIGH (ref 22–32)
Calcium: 9.7 mg/dL (ref 8.9–10.3)
Chloride: 90 mmol/L — ABNORMAL LOW (ref 98–111)
Creatinine, Ser: 0.69 mg/dL (ref 0.44–1.00)
GFR calc Af Amer: >60 mL/min (ref >60)
GFR calc non Af Amer: >60 mL/min (ref >60)
Glucose, Bld: 135 mg/dL — ABNORMAL HIGH (ref 70–99)
Potassium: 3.4 mmol/L — ABNORMAL LOW (ref 3.5–5.1)
Sodium: 138 mmol/L (ref 135–145)

## 2020-01-01 LAB — PHOSPHORUS: Phosphorus: 3.9 mg/dL (ref 2.5–4.6)

## 2020-01-01 LAB — MAGNESIUM: Magnesium: 2.2 mg/dL (ref 1.7–2.4)

## 2020-01-01 MED ORDER — GENERIC EXTERNAL MEDICATION
Status: DC
Start: ? — End: 2020-01-01

## 2020-01-01 NOTE — Progress Notes (Addendum)
Pulmonary Critical Care Medicine [facility] [place]   PULMONARY CRITICAL CARE SERVICE  PROGRESS NOTE  Date of Service: 01/01/2020  Kelly Dawson  OZH:[id number]  DOB: 10/05/1951   DOA: 12/10/2019  Referring Physician: Carron Curie, MD  HPI: Kelly Dawson is a 69 y.o. female seen for follow up of Acute on Chronic Respiratory Failure.  Patient has an 8-hour goal today on NAG 4 L.  Currently satting well no distress.  Medications: Reviewed on Rounds  Physical Exam:  Vitals: Pulse 82 respirations 16 BP 164/74 O2 sat 100% temp 98.1  Ventilator Settings NAG 4 L  . General: Comfortable at this time . Eyes: Grossly normal lids, irises & conjunctiva . ENT: grossly tongue is normal . Neck: no obvious mass . Cardiovascular: S1 S2 normal no gallop . Respiratory: No rales or rhonchi noted . Abdomen: soft . Skin: no rash seen on limited exam . Musculoskeletal: not rigid . Psychiatric:unable to assess . Neurologic: no seizure no involuntary movements         Lab Data:   Basic Metabolic Panel: Recent Labs  Lab 12/26/19 1139 12/28/19 0734 12/30/19 0556 01/01/20 0654  NA 139 141 136 138  K 4.2 4.0 3.6 3.4*  CL 89* 88* 89* 90*  CO2 37* 36* 36* 39*  GLUCOSE 319* 159* 156* 135*  BUN 28* 21 24* 28*  CREATININE 1.00 0.72 0.76 0.69  CALCIUM 9.8 9.8 9.9 9.7  MG 2.7* 2.1 2.2 2.2  PHOS 6.5* 3.3 4.1 3.9    ABG: No results for input(s): PHART, PCO2ART, PO2ART, HCO3, O2SAT in the last 168 hours.  Liver Function Tests: Recent Labs  Lab 12/28/19 0734  ALBUMIN 3.1*   No results for input(s): LIPASE, AMYLASE in the last 168 hours. No results for input(s): AMMONIA in the last 168 hours.  CBC: Recent Labs  Lab 12/26/19 1139 12/28/19 0734 12/30/19 0556 01/01/20 0654  WBC 24.3* 5.5 6.4 4.2  HGB 10.5* 9.1* 10.0* 9.7*  HCT 36.9 31.6* 34.1* 33.5*  MCV 96.6 94.9 94.2 93.8  PLT 370 229 242 227    Cardiac Enzymes: No results for input(s): CKTOTAL, CKMB, CKMBINDEX,  TROPONINI in the last 168 hours.  BNP (last 3 results) No results for input(s): BNP in the last 8760 hours.  ProBNP (last 3 results) No results for input(s): PROBNP in the last 8760 hours.  Radiological Exams: No results found.  Assessment/Plan Active Problems:   Acute on chronic respiratory failure with hypoxia (HCC)   COVID-19 virus infection   Myasthenia gravis (HCC)   Multifocal pneumonia   Pneumonia due to COVID-19 virus   Critical illness polyneuropathy (HCC)   1. Acute on chronic respiratory failure with hypoxia  patient will continue to wean currently on NAG 4 L.  Has an 8-hour goal today.  Satting well at this time continue aggressive pulmonary transplant measures. 2. COVID-19 virus infection treated we will continue with supportive care 3. Myasthenia gravis at baseline 4. Multifocal pneumonia no change 5. COVID-19 pneumonia treated improving 6. Critical illness neuropathy no change we will continue with supportive care   I have personally seen and evaluated the patient, evaluated laboratory and imaging results, formulated the assessment and plan and placed orders. The Patient requires high complexity decision making with multiple systems involvement.  Rounds were done with the Respiratory Therapy Director and Staff therapists and discussed with nursing staff also.  Yevonne Pax, MD [facility] Pulmonary Critical Care Medicine Sleep Medicine

## 2020-01-02 ENCOUNTER — Other Ambulatory Visit (HOSPITAL_COMMUNITY): Payer: Medicare Other

## 2020-01-02 DIAGNOSIS — J9621 Acute and chronic respiratory failure with hypoxia: Secondary | ICD-10-CM | POA: Diagnosis not present

## 2020-01-02 DIAGNOSIS — G6281 Critical illness polyneuropathy: Secondary | ICD-10-CM | POA: Diagnosis not present

## 2020-01-02 DIAGNOSIS — J189 Pneumonia, unspecified organism: Secondary | ICD-10-CM | POA: Diagnosis not present

## 2020-01-02 DIAGNOSIS — U071 COVID-19: Secondary | ICD-10-CM | POA: Diagnosis not present

## 2020-01-02 LAB — POTASSIUM: Potassium: 4 mmol/L (ref 3.5–5.1)

## 2020-01-02 MED ORDER — GENERIC EXTERNAL MEDICATION
Status: DC
Start: ? — End: 2020-01-02

## 2020-01-02 NOTE — Progress Notes (Signed)
Pulmonary Critical Care Medicine [facility] [place]   PULMONARY CRITICAL CARE SERVICE  PROGRESS NOTE  Date of Service: 01/02/2020  Kelly Dawson  YNW:[id number]  DOB: 03-06-1951   DOA: 12/10/2019  Referring Physician: Carron Curie, MD  HPI: Kelly Dawson is a 69 y.o. female seen for follow up of Acute on Chronic Respiratory Failure.  Patient is currently on the NAG now 3 L oxygen  Medications: Reviewed on Rounds  Physical Exam:  Vitals: Temperature 98.8 pulse 80 respiratory 21 blood pressure is 150/67 saturations 99%  Ventilator Settings on NAG on 3 L with a goal of 10 hours  . General: Comfortable at this time . Eyes: Grossly normal lids, irises & conjunctiva . ENT: grossly tongue is normal . Neck: no obvious mass . Cardiovascular: S1 S2 normal no gallop . Respiratory: No rhonchi coarse breath sounds are noted . Abdomen: soft . Skin: no rash seen on limited exam . Musculoskeletal: not rigid . Psychiatric:unable to assess . Neurologic: no seizure no involuntary movements         Lab Data:   Basic Metabolic Panel: Recent Labs  Lab 12/28/19 0734 12/30/19 0556 01/01/20 0654 01/02/20 0502  NA 141 136 138  --   K 4.0 3.6 3.4* 4.0  CL 88* 89* 90*  --   CO2 36* 36* 39*  --   GLUCOSE 159* 156* 135*  --   BUN 21 24* 28*  --   CREATININE 0.72 0.76 0.69  --   CALCIUM 9.8 9.9 9.7  --   MG 2.1 2.2 2.2  --   PHOS 3.3 4.1 3.9  --     ABG: No results for input(s): PHART, PCO2ART, PO2ART, HCO3, O2SAT in the last 168 hours.  Liver Function Tests: Recent Labs  Lab 12/28/19 0734  ALBUMIN 3.1*   No results for input(s): LIPASE, AMYLASE in the last 168 hours. No results for input(s): AMMONIA in the last 168 hours.  CBC: Recent Labs  Lab 12/28/19 0734 12/30/19 0556 01/01/20 0654  WBC 5.5 6.4 4.2  HGB 9.1* 10.0* 9.7*  HCT 31.6* 34.1* 33.5*  MCV 94.9 94.2 93.8  PLT 229 242 227    Cardiac Enzymes: No results for input(s): CKTOTAL, CKMB, CKMBINDEX,  TROPONINI in the last 168 hours.  BNP (last 3 results) No results for input(s): BNP in the last 8760 hours.  ProBNP (last 3 results) No results for input(s): PROBNP in the last 8760 hours.  Radiological Exams: DG CHEST PORT 1 VIEW  Result Date: 01/02/2020 CLINICAL DATA:  69 year old female with possible multifocal pneumonia on recent portable chest. EXAM: PORTABLE CHEST 1 VIEW COMPARISON:  12/30/2019 portable chest and earlier. FINDINGS: Portable AP upright view at 0626 hours. Stable tracheostomy. Improved ventilation since 12/30/2019 and now when Allowing for portable technique the lungs are clear. Stable cardiac size and mediastinal contours. Calcified aortic atherosclerosis. Negative visible bowel gas pattern. No acute osseous abnormality identified. IMPRESSION: Improved ventilation since 12/30/2019. No acute cardiopulmonary abnormality. Electronically Signed   By: Odessa Fleming M.D.   On: 01/02/2020 07:14    Assessment/Plan Active Problems:   Acute on chronic respiratory failure with hypoxia (HCC)   COVID-19 virus infection   Myasthenia gravis (HCC)   Multifocal pneumonia   Pneumonia due to COVID-19 virus   Critical illness polyneuropathy (HCC)   1. Acute on chronic respiratory failure hypoxia plan is to continue with the NAG titrate oxygen continue pulmonary toilet. 2. COVID-19 virus infection treated we will continue with present management  3. Myasthenia gravis continue with present management medical management 4. Pneumonia due to COVID-19 improving clinically we will continue to follow last chest x-ray was showing some improvement of multifocal pneumonia 5. Critical illness polyneuropathy continue with supportive care   I have personally seen and evaluated the patient, evaluated laboratory and imaging results, formulated the assessment and plan and placed orders. The Patient requires high complexity decision making with multiple systems involvement.  Rounds were done with the  Respiratory Therapy Director and Staff therapists and discussed with nursing staff also.  Allyne Gee, MD [facility] Pulmonary Critical Care Medicine Sleep Medicine

## 2020-01-03 DIAGNOSIS — G6281 Critical illness polyneuropathy: Secondary | ICD-10-CM | POA: Diagnosis not present

## 2020-01-03 DIAGNOSIS — U071 COVID-19: Secondary | ICD-10-CM | POA: Diagnosis not present

## 2020-01-03 DIAGNOSIS — J9621 Acute and chronic respiratory failure with hypoxia: Secondary | ICD-10-CM | POA: Diagnosis not present

## 2020-01-03 DIAGNOSIS — J189 Pneumonia, unspecified organism: Secondary | ICD-10-CM | POA: Diagnosis not present

## 2020-01-03 NOTE — Progress Notes (Signed)
Pulmonary Critical Care Medicine [place]   PULMONARY CRITICAL CARE SERVICE  PROGRESS NOTE  Date of Service: 01/03/2020  NUHA DEGNER  VFI:[id number]  DOB: [birth year]   DOA: 12/10/2019  Referring Physician: Merton Border, MD  HPI: KATHLEEN TAMM is a 69 y.o. female seen for follow up of Acute on Chronic Respiratory Failure.  Patient right now is off the ventilator on the NAG the goal is for 16 hours  Medications: Reviewed on Rounds  Physical Exam:  Vitals: Temperature is 98.2 pulse 90 respiratory 20 blood pressure is 179/80 saturations 100%  Ventilator Settings off the ventilator on the NAG  . General: Comfortable at this time . Eyes: Grossly normal lids, irises & conjunctiva . ENT: grossly tongue is normal . Neck: no obvious mass . Cardiovascular: S1 S2 normal no gallop . Respiratory: No rhonchi no rales are noted at this time . Abdomen: soft . Skin: no rash seen on limited exam . Musculoskeletal: not rigid . Psychiatric:unable to assess . Neurologic: no seizure no involuntary movements         Lab Data:   Basic Metabolic Panel: Recent Labs  Lab 12/28/19 0734 12/30/19 0556 01/01/20 0654 01/02/20 0502  NA 141 136 138  --   K 4.0 3.6 3.4* 4.0  CL 88* 89* 90*  --   CO2 36* 36* 39*  --   GLUCOSE 159* 156* 135*  --   BUN 21 24* 28*  --   CREATININE 0.72 0.76 0.69  --   CALCIUM 9.8 9.9 9.7  --   MG 2.1 2.2 2.2  --   PHOS 3.3 4.1 3.9  --     ABG: No results for input(s): PHART, PCO2ART, PO2ART, HCO3, O2SAT in the last 168 hours.  Liver Function Tests: Recent Labs  Lab 12/28/19 0734  ALBUMIN 3.1*   No results for input(s): LIPASE, AMYLASE in the last 168 hours. No results for input(s): AMMONIA in the last 168 hours.  CBC: Recent Labs  Lab 12/28/19 0734 12/30/19 0556 01/01/20 0654  WBC 5.5 6.4 4.2  HGB 9.1* 10.0* 9.7*  HCT 31.6* 34.1* 33.5*  MCV 94.9 94.2 93.8  PLT 229 242 227    Cardiac Enzymes: No results for input(s):  CKTOTAL, CKMB, CKMBINDEX, TROPONINI in the last 168 hours.  BNP (last 3 results) No results for input(s): BNP in the last 8760 hours.  ProBNP (last 3 results) No results for input(s): PROBNP in the last 8760 hours.  Radiological Exams: DG CHEST PORT 1 VIEW  Result Date: 01/02/2020 CLINICAL DATA:  69 year old female with possible multifocal pneumonia on recent portable chest. EXAM: PORTABLE CHEST 1 VIEW COMPARISON:  12/30/2019 portable chest and earlier. FINDINGS: Portable AP upright view at 0626 hours. Stable tracheostomy. Improved ventilation since 12/30/2019 and now when Allowing for portable technique the lungs are clear. Stable cardiac size and mediastinal contours. Calcified aortic atherosclerosis. Negative visible bowel gas pattern. No acute osseous abnormality identified. IMPRESSION: Improved ventilation since 12/30/2019. No acute cardiopulmonary abnormality. Electronically Signed   By: Genevie Ann M.D.   On: 01/02/2020 07:14    Assessment/Plan Active Problems:   Acute on chronic respiratory failure with hypoxia (HCC)   COVID-19 virus infection   Myasthenia gravis ([place])   Multifocal pneumonia   Pneumonia due to COVID-19 virus   Critical illness polyneuropathy ([place])   1. Acute on chronic respiratory failure with hypoxia patient currently is on the ABG on 2 L as mentioned above the goal is for 16 hours  2. COVID-19 virus infection treated we will continue with supportive care 3. Multifocal pneumonia no changes 4. Pneumonia due to COVID-19 treated 5. Critical illness polyneuropathy at baseline we will continue with supportive care   I have personally seen and evaluated the patient, evaluated laboratory and imaging results, formulated the assessment and plan and placed orders. The Patient requires high complexity decision making with multiple systems involvement.  Rounds were done with the Respiratory Therapy Director and Staff therapists and discussed with nursing staff also.  Yevonne Pax, MD [facility] Pulmonary Critical Care Medicine Sleep Medicine

## 2020-01-04 DIAGNOSIS — J9621 Acute and chronic respiratory failure with hypoxia: Secondary | ICD-10-CM | POA: Diagnosis not present

## 2020-01-04 DIAGNOSIS — G6281 Critical illness polyneuropathy: Secondary | ICD-10-CM | POA: Diagnosis not present

## 2020-01-04 DIAGNOSIS — U071 COVID-19: Secondary | ICD-10-CM | POA: Diagnosis not present

## 2020-01-04 DIAGNOSIS — J189 Pneumonia, unspecified organism: Secondary | ICD-10-CM | POA: Diagnosis not present

## 2020-01-04 NOTE — Progress Notes (Addendum)
Pulmonary Critical Care Medicine [facility] [place]   PULMONARY CRITICAL CARE SERVICE  PROGRESS NOTE  Date of Service: 01/04/2020  Kelly Dawson  MBE:[id number]  DOB: 1951-05-07   DOA: 12/10/2019  Referring Physician: Carron Curie, MD  HPI: Kelly Dawson is a 69 y.o. female seen for follow up of Acute on Chronic Respiratory Failure.  Patient remains on NAG 4 L for 16-hour goal today.  Currently satting well distress.  Medications: Reviewed on Rounds  Physical Exam:  Vitals: Pulse 100 respirations 24 BP 177/79 O2 sat 97% temp 98.1  Ventilator Settings NAG 4 L  . General: Comfortable at this time . Eyes: Grossly normal lids, irises & conjunctiva . ENT: grossly tongue is normal . Neck: no obvious mass . Cardiovascular: S1 S2 normal no gallop . Respiratory: No rales or rhonchi noted . Abdomen: soft . Skin: no rash seen on limited exam . Musculoskeletal: not rigid . Psychiatric:unable to assess . Neurologic: no seizure no involuntary movements         Lab Data:   Basic Metabolic Panel: Recent Labs  Lab 12/30/19 0556 01/01/20 0654 01/02/20 0502  NA 136 138  --   K 3.6 3.4* 4.0  CL 89* 90*  --   CO2 36* 39*  --   GLUCOSE 156* 135*  --   BUN 24* 28*  --   CREATININE 0.76 0.69  --   CALCIUM 9.9 9.7  --   MG 2.2 2.2  --   PHOS 4.1 3.9  --     ABG: No results for input(s): PHART, PCO2ART, PO2ART, HCO3, O2SAT in the last 168 hours.  Liver Function Tests: No results for input(s): AST, ALT, ALKPHOS, BILITOT, PROT, ALBUMIN in the last 168 hours. No results for input(s): LIPASE, AMYLASE in the last 168 hours. No results for input(s): AMMONIA in the last 168 hours.  CBC: Recent Labs  Lab 12/30/19 0556 01/01/20 0654  WBC 6.4 4.2  HGB 10.0* 9.7*  HCT 34.1* 33.5*  MCV 94.2 93.8  PLT 242 227    Cardiac Enzymes: No results for input(s): CKTOTAL, CKMB, CKMBINDEX, TROPONINI in the last 168 hours.  BNP (last 3 results) No results for input(s): BNP  in the last 8760 hours.  ProBNP (last 3 results) No results for input(s): PROBNP in the last 8760 hours.  Radiological Exams: No results found.  Assessment/Plan Active Problems:   Acute on chronic respiratory failure with hypoxia (HCC)   COVID-19 virus infection   Myasthenia gravis (HCC)   Multifocal pneumonia   Pneumonia due to COVID-19 virus   Critical illness polyneuropathy (HCC)   1. Acute on chronic respiratory failure with hypoxia patient currently is on the NAG on 4 expect 8 L as mentioned above the goal is for 16 hours 2. COVID-19 virus infection treated we will continue with supportive care 3. Multifocal pneumonia no changes 4. Pneumonia due to COVID-19 treated 5. Critical illness polyneuropathy at baseline we will continue with supportive care.   I have personally seen and evaluated the patient, evaluated laboratory and imaging results, formulated the assessment and plan and placed orders. The Patient requires high complexity decision making with multiple systems involvement.  Rounds were done with the Respiratory Therapy Director and Staff therapists and discussed with nursing staff also.  Yevonne Pax, MD [facility] Pulmonary Critical Care Medicine Sleep Medicine

## 2020-01-05 DIAGNOSIS — G6281 Critical illness polyneuropathy: Secondary | ICD-10-CM | POA: Diagnosis not present

## 2020-01-05 DIAGNOSIS — U071 COVID-19: Secondary | ICD-10-CM | POA: Diagnosis not present

## 2020-01-05 DIAGNOSIS — J189 Pneumonia, unspecified organism: Secondary | ICD-10-CM | POA: Diagnosis not present

## 2020-01-05 DIAGNOSIS — J9621 Acute and chronic respiratory failure with hypoxia: Secondary | ICD-10-CM | POA: Diagnosis not present

## 2020-01-05 LAB — BASIC METABOLIC PANEL
Anion gap: 15 (ref 5–15)
BUN: 31 mg/dL — ABNORMAL HIGH (ref 8–23)
CO2: 41 mmol/L — ABNORMAL HIGH (ref 22–32)
Calcium: 10.9 mg/dL — ABNORMAL HIGH (ref 8.9–10.3)
Chloride: 87 mmol/L — ABNORMAL LOW (ref 98–111)
Creatinine, Ser: 0.65 mg/dL (ref 0.44–1.00)
GFR calc Af Amer: >60 mL/min (ref >60)
GFR calc non Af Amer: >60 mL/min (ref >60)
Glucose, Bld: 233 mg/dL — ABNORMAL HIGH (ref 70–99)
Potassium: 4.9 mmol/L (ref 3.5–5.1)
Sodium: 143 mmol/L (ref 135–145)

## 2020-01-05 LAB — MAGNESIUM: Magnesium: 2.2 mg/dL (ref 1.7–2.4)

## 2020-01-05 LAB — CBC
HCT: 39.7 % (ref 36.0–46.0)
Hemoglobin: 11 g/dL — ABNORMAL LOW (ref 12.0–15.0)
MCH: 27.8 pg (ref 26.0–34.0)
MCHC: 27.7 g/dL — ABNORMAL LOW (ref 30.0–36.0)
MCV: 100.3 fL — ABNORMAL HIGH (ref 80.0–100.0)
Platelets: 278 10*3/uL (ref 150–400)
RBC: 3.96 MIL/uL (ref 3.87–5.11)
RDW: 17.2 % — ABNORMAL HIGH (ref 11.5–15.5)
WBC: 8.3 10*3/uL (ref 4.0–10.5)
nRBC: 0 % (ref 0.0–0.2)

## 2020-01-05 NOTE — Progress Notes (Addendum)
Pulmonary Critical Care Medicine [place]   PULMONARY CRITICAL CARE SERVICE  PROGRESS NOTE  Date of Service: 01/05/2020  Kelly Dawson  GUY:[id number]  DOB: June 29, 1951   DOA: 12/10/2019  Referring Physician: Merton Border, MD  HPI: Kelly Dawson is a 69 y.o. female seen for follow up of Acute on Chronic Respiratory Failure.  Patient right now is on full support on the ventilator she states that she is feeling weak as if her myasthenia gravis seems to be exacerbating.  Apparently she has felt this way before and she states that she might need IVIG I will need to discuss this with the primary care team.  Medications: Reviewed on Rounds  Physical Exam:  Vitals: Temperature 98.2 pulse 101 respiratory rate is 28 blood pressure is 148/64 saturations 100%  Ventilator Settings on pressure assist control FiO2 is 30% IP 18 PEEP 5  . General: Comfortable at this time . Eyes: Grossly normal lids, irises & conjunctiva . ENT: grossly tongue is normal . Neck: no obvious mass . Cardiovascular: S1 S2 normal no gallop . Respiratory: Scattered rhonchi coarse breath sounds . Abdomen: soft . Skin: no rash seen on limited exam . Musculoskeletal: not rigid . Psychiatric:unable to assess . Neurologic: no seizure no involuntary movements         Lab Data:   Basic Metabolic Panel: Recent Labs  Lab 12/30/19 0556 01/01/20 0654 01/02/20 0502 01/05/20 0510  NA 136 138  --  143  K 3.6 3.4* 4.0 4.9  CL 89* 90*  --  87*  CO2 36* 39*  --  41*  GLUCOSE 156* 135*  --  233*  BUN 24* 28*  --  31*  CREATININE 0.76 0.69  --  0.65  CALCIUM 9.9 9.7  --  10.9*  MG 2.2 2.2  --  2.2  PHOS 4.1 3.9  --   --     ABG: No results for input(s): PHART, PCO2ART, PO2ART, HCO3, O2SAT in the last 168 hours.  Liver Function Tests: No results for input(s): AST, ALT, ALKPHOS, BILITOT, PROT, ALBUMIN in the last 168 hours. No results for input(s): LIPASE, AMYLASE in the last 168 hours. No  results for input(s): AMMONIA in the last 168 hours.  CBC: Recent Labs  Lab 12/30/19 0556 01/01/20 0654 01/05/20 0510  WBC 6.4 4.2 8.3  HGB 10.0* 9.7* 11.0*  HCT 34.1* 33.5* 39.7  MCV 94.2 93.8 100.3*  PLT 242 227 278    Cardiac Enzymes: No results for input(s): CKTOTAL, CKMB, CKMBINDEX, TROPONINI in the last 168 hours.  BNP (last 3 results) No results for input(s): BNP in the last 8760 hours.  ProBNP (last 3 results) No results for input(s): PROBNP in the last 8760 hours.  Radiological Exams: No results found.  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 Acute on chronic respiratory failure hypoxia as she has been consistently failing weaning a day and feels like her respiratory status is worsening because of her myasthenia gravis may be under exacerbation.  I am not sure if her cholinergic medications were decreased because of the bradycardia that she was previously having.  If this is the case we may need to put her back on her full dose medications 2. Myasthenia gravis as discussed above may need medication adjustment  will discuss with primary care team 3. COVID-19 virus infection has been treated we will continue with supportive care 4. Multifocal pneumonia treated follow radiologically 5. Critical illness polyneuropathy we will continue with present management   I have personally seen and evaluated the patient, evaluated laboratory and imaging results, formulated the assessment and plan and placed orders. The Patient requires high complexity decision making with multiple systems involvement.  Rounds were done with the Respiratory Therapy Director and Staff therapists and discussed with nursing staff also.  Yevonne Pax, MD [facility] Pulmonary Critical Care  Medicine Sleep Medicine

## 2020-01-05 NOTE — Consult Note (Signed)
Ref: McBroom, [place] S, MD   Subjective:  Awake. Somewhat depressed. Monitor shows Normal sinus rhythm. One episode of 2nd degree AV block probably type 1 as she has 2: 1 block. Recently increased dose of Clonidine and pyridostigmine.  On ventilator support.  Objective:  Vital Signs in the last 24 hours:    Physical Exam: BP Readings from Last 1 Encounters:  No data found for BP     Wt Readings from Last 1 Encounters:  No data found for Wt    Weight change:  There is no height or weight on file to calculate BMI. HEENT: [place]/AT, Eyes-Blue, PERL, EOMI, Conjunctiva-Pink, Sclera-Non-icteric Neck: No JVD, No bruit, Trachea midline. Lungs:  Clear, Bilateral. Cardiac:  Regular rhythm, normal S1 and S2, no S3. II/VI systolic murmur. Abdomen:  Soft, non-tender. BS present. Extremities:  Trace bilateral lower leg edema present. No cyanosis. No clubbing. CNS: AxOx3, Cranial nerves grossly intact, moves all 4 extremities.  Skin: Warm and dry.   Intake/Output from previous day: No intake/output data recorded.    Lab Results: BMET    Component Value Date/Time   NA 143 01/05/2020 0510   NA 138 01/01/2020 0654   NA 136 12/30/2019 0556   K 4.9 01/05/2020 0510   K 4.0 01/02/2020 0502   K 3.4 (L) 01/01/2020 0654   CL 87 (L) 01/05/2020 0510   CL 90 (L) 01/01/2020 0654   CL 89 (L) 12/30/2019 0556   CO2 41 (H) 01/05/2020 0510   CO2 39 (H) 01/01/2020 0654   CO2 36 (H) 12/30/2019 0556   GLUCOSE 233 (H) 01/05/2020 0510   GLUCOSE 135 (H) 01/01/2020 0654   GLUCOSE 156 (H) 12/30/2019 0556   BUN 31 (H) 01/05/2020 0510   BUN 28 (H) 01/01/2020 0654   BUN 24 (H) 12/30/2019 0556   CREATININE 0.65 01/05/2020 0510   CREATININE 0.69 01/01/2020 0654   CREATININE 0.76 12/30/2019 0556   CALCIUM 10.9 (H) 01/05/2020 0510   CALCIUM 9.7 01/01/2020 0654   CALCIUM 9.9 12/30/2019 0556   GFRNONAA >60 01/05/2020 0510   GFRNONAA >60 01/01/2020 0654   GFRNONAA >60 12/30/2019 0556   GFRAA >60 01/05/2020  0510   GFRAA >60 01/01/2020 0654   GFRAA >60 12/30/2019 0556   CBC    Component Value Date/Time   WBC 8.3 01/05/2020 0510   RBC 3.96 01/05/2020 0510   HGB 11.0 (L) 01/05/2020 0510   HCT 39.7 01/05/2020 0510   PLT 278 01/05/2020 0510   MCV 100.3 (H) 01/05/2020 0510   MCH 27.8 01/05/2020 0510   MCHC 27.7 (L) 01/05/2020 0510   RDW 17.2 (H) 01/05/2020 0510   LYMPHSABS 1.3 12/11/2019 0618   MONOABS 0.4 12/11/2019 0618   EOSABS 0.1 12/11/2019 0618   BASOSABS 0.0 12/11/2019 0618   HEPATIC Function Panel Recent Labs    12/11/19 0618  PROT 6.2*   HEMOGLOBIN A1C No components found for: HGA1C,  MPG CARDIAC ENZYMES Lab Results  Component Value Date   CKTOTAL 14 (L) 12/21/2019   CKMB 1.2 12/21/2019   BNP No results for input(s): PROBNP in the last 8760 hours. TSH Recent Labs    12/11/19 0618  TSH 4.042   CHOLESTEROL No results for input(s): CHOL in the last 8760 hours.  Scheduled Meds: Continuous Infusions: PRN Meds:.  Assessment/Plan: Paroxysmal high grade AV block  Acute on chronic respiratory failure with hypoxemia S/P COVID pneumonia Myasthenia Gravis Type 2 DM Polyneuropathy H/O diastolic left heart failure H/O tobacco use disorder  Agree  with holding B-blocker. Add Losartan for BP control.   LOS: 0 days   Time spent including chart review, lab review, examination, discussion with patient and PA : 30 min   Dixie Dials  MD  01/05/2020, 10:02 AM

## 2020-01-06 DIAGNOSIS — J189 Pneumonia, unspecified organism: Secondary | ICD-10-CM | POA: Diagnosis not present

## 2020-01-06 DIAGNOSIS — J9621 Acute and chronic respiratory failure with hypoxia: Secondary | ICD-10-CM | POA: Diagnosis not present

## 2020-01-06 DIAGNOSIS — U071 COVID-19: Secondary | ICD-10-CM | POA: Diagnosis not present

## 2020-01-06 DIAGNOSIS — G6281 Critical illness polyneuropathy: Secondary | ICD-10-CM | POA: Diagnosis not present

## 2020-01-06 NOTE — Progress Notes (Addendum)
Pulmonary Critical Care Medicine [facility] [place]   PULMONARY CRITICAL CARE SERVICE  PROGRESS NOTE  Date of Service: 01/06/2020  Kelly Dawson  ASN:[id number]  DOB: Nov 18, 1951   DOA: 12/10/2019  Referring Physician: Carron Curie, MD  HPI: Kelly Dawson is a 69 y.o. female seen for follow up of Acute on Chronic Respiratory Failure.  Patient remains on pressure support at this time with FiO2 30% satting well no fever or distress.  Medications: Reviewed on Rounds  Physical Exam:  Vitals: Pulse 84 respirations 24 BP 179/75 O2 sat 96% temp 98.4  Ventilator Settings pressure support 16/5 FiO2 30%  . General: Comfortable at this time . Eyes: Grossly normal lids, irises & conjunctiva . ENT: grossly tongue is normal . Neck: no obvious mass . Cardiovascular: S1 S2 normal no gallop . Respiratory: No rales or rhonchi noted . Abdomen: soft . Skin: no rash seen on limited exam . Musculoskeletal: not rigid . Psychiatric:unable to assess . Neurologic: no seizure no involuntary movements         Lab Data:   Basic Metabolic Panel: Recent Labs  Lab 01/01/20 0654 01/02/20 0502 01/05/20 0510  NA 138  --  143  K 3.4* 4.0 4.9  CL 90*  --  87*  CO2 39*  --  41*  GLUCOSE 135*  --  233*  BUN 28*  --  31*  CREATININE 0.69  --  0.65  CALCIUM 9.7  --  10.9*  MG 2.2  --  2.2  PHOS 3.9  --   --     ABG: No results for input(s): PHART, PCO2ART, PO2ART, HCO3, O2SAT in the last 168 hours.  Liver Function Tests: No results for input(s): AST, ALT, ALKPHOS, BILITOT, PROT, ALBUMIN in the last 168 hours. No results for input(s): LIPASE, AMYLASE in the last 168 hours. No results for input(s): AMMONIA in the last 168 hours.  CBC: Recent Labs  Lab 01/01/20 0654 01/05/20 0510  WBC 4.2 8.3  HGB 9.7* 11.0*  HCT 33.5* 39.7  MCV 93.8 100.3*  PLT 227 278    Cardiac Enzymes: No results for input(s): CKTOTAL, CKMB, CKMBINDEX, TROPONINI in the last 168 hours.  BNP (last 3  results) No results for input(s): BNP in the last 8760 hours.  ProBNP (last 3 results) No results for input(s): PROBNP in the last 8760 hours.  Radiological Exams: No results found.  Assessment/Plan Active Problems:   Acute on chronic respiratory failure with hypoxia (HCC)   COVID-19 virus infection   Myasthenia gravis (HCC)   Multifocal pneumonia   Pneumonia due to COVID-19 virus   Critical illness polyneuropathy (HCC)   1. Acute on chronic respiratory failure hypoxia she remains on pressure support at this time unable to wean any further.  We will continue aggressive pulmonary toilet and supportive measures. 2. Myasthenia gravis as discussed above may need medication adjustment will discuss with primary care team 3. COVID-19 virus infection has been treated we will continue with supportive care 4. Multifocal pneumonia treated follow radiologically 5. Critical illness polyneuropathy we will continue with present management   I have personally seen and evaluated the patient, evaluated laboratory and imaging results, formulated the assessment and plan and placed orders. The Patient requires high complexity decision making with multiple systems involvement.  Rounds were done with the Respiratory Therapy Director and Staff therapists and discussed with nursing staff also.  Yevonne Pax, MD [facility] Pulmonary Critical Care Medicine Sleep Medicine

## 2020-01-06 NOTE — Consult Note (Signed)
Ref: McBroom, [place] S, MD   Subjective:  Losartan helping with BP control. No chest pain per patient. Monitor : Sinus rhythm with rare 2nd degree AV block. Weakness is improving.  Objective:  Vital Signs in the last 24 hours:  T-98 degree F, R: 24, Pulse: 90-100, BP : 150/65, O2 Sat 99 %  Physical Exam: BP Readings from Last 1 Encounters:  No data found for BP     Wt Readings from Last 1 Encounters:  No data found for Wt    Weight change:  There is no height or weight on file to calculate BMI. HEENT: [place]/AT, Eyes-Blue, PERL, EOMI, Conjunctiva-Pink, Sclera-Non-icteric Neck: No JVD, No bruit, Trachea midline. Lungs:  Clear, Bilateral. Cardiac:  Regular rhythm, normal S1 and S2, no S3. II/VI systolic murmur. Abdomen:  Soft, non-tender. BS present. Extremities:  Trace lower leg edema present. No cyanosis. No clubbing. CNS: AxOx3, Cranial nerves grossly intact, moves all 4 extremities.  Skin: Warm and dry.   Intake/Output from previous day: No intake/output data recorded.    Lab Results: BMET    Component Value Date/Time   NA 143 01/05/2020 0510   NA 138 01/01/2020 0654   NA 136 12/30/2019 0556   K 4.9 01/05/2020 0510   K 4.0 01/02/2020 0502   K 3.4 (L) 01/01/2020 0654   CL 87 (L) 01/05/2020 0510   CL 90 (L) 01/01/2020 0654   CL 89 (L) 12/30/2019 0556   CO2 41 (H) 01/05/2020 0510   CO2 39 (H) 01/01/2020 0654   CO2 36 (H) 12/30/2019 0556   GLUCOSE 233 (H) 01/05/2020 0510   GLUCOSE 135 (H) 01/01/2020 0654   GLUCOSE 156 (H) 12/30/2019 0556   BUN 31 (H) 01/05/2020 0510   BUN 28 (H) 01/01/2020 0654   BUN 24 (H) 12/30/2019 0556   CREATININE 0.65 01/05/2020 0510   CREATININE 0.69 01/01/2020 0654   CREATININE 0.76 12/30/2019 0556   CALCIUM 10.9 (H) 01/05/2020 0510   CALCIUM 9.7 01/01/2020 0654   CALCIUM 9.9 12/30/2019 0556   GFRNONAA >60 01/05/2020 0510   GFRNONAA >60 01/01/2020 0654   GFRNONAA >60 12/30/2019 0556   GFRAA >60 01/05/2020 0510   GFRAA >60 01/01/2020  0654   GFRAA >60 12/30/2019 0556   CBC    Component Value Date/Time   WBC 8.3 01/05/2020 0510   RBC 3.96 01/05/2020 0510   HGB 11.0 (L) 01/05/2020 0510   HCT 39.7 01/05/2020 0510   PLT 278 01/05/2020 0510   MCV 100.3 (H) 01/05/2020 0510   MCH 27.8 01/05/2020 0510   MCHC 27.7 (L) 01/05/2020 0510   RDW 17.2 (H) 01/05/2020 0510   LYMPHSABS 1.3 12/11/2019 0618   MONOABS 0.4 12/11/2019 0618   EOSABS 0.1 12/11/2019 0618   BASOSABS 0.0 12/11/2019 0618   HEPATIC Function Panel Recent Labs    12/11/19 0618  PROT 6.2*   HEMOGLOBIN A1C No components found for: HGA1C,  MPG CARDIAC ENZYMES Lab Results  Component Value Date   CKTOTAL 14 (L) 12/21/2019   CKMB 1.2 12/21/2019   BNP No results for input(s): PROBNP in the last 8760 hours. TSH Recent Labs    12/11/19 0618  TSH 4.042   CHOLESTEROL No results for input(s): CHOL in the last 8760 hours.  Scheduled Meds: Continuous Infusions: PRN Meds:.  Assessment/Plan: Paroxysmal high grade AV block Acute on chronic respiratory failure with hypoxemia S/P COVID pneumonia Type 2 DM Polyneuropathy H/O diastolic heart failure H/O tobacco use disorder  Increased pyridostigmine dose and Losartan  dose is tolerated. EP feels patient is high risk for pacemaker infection. Will monitor over weekend and reconsider pacemaker need evaluation   LOS: 0 days   Time spent including chart review, lab review, examination, discussion with patient : 30 min   Dixie Dials  MD  01/06/2020, 8:37 PM

## 2020-01-07 DIAGNOSIS — J9621 Acute and chronic respiratory failure with hypoxia: Secondary | ICD-10-CM | POA: Diagnosis not present

## 2020-01-07 DIAGNOSIS — J189 Pneumonia, unspecified organism: Secondary | ICD-10-CM | POA: Diagnosis not present

## 2020-01-07 DIAGNOSIS — U071 COVID-19: Secondary | ICD-10-CM | POA: Diagnosis not present

## 2020-01-07 DIAGNOSIS — G6281 Critical illness polyneuropathy: Secondary | ICD-10-CM | POA: Diagnosis not present

## 2020-01-07 LAB — BASIC METABOLIC PANEL
Anion gap: 9 (ref 5–15)
BUN: 32 mg/dL — ABNORMAL HIGH (ref 8–23)
CO2: 38 mmol/L — ABNORMAL HIGH (ref 22–32)
Calcium: 9.7 mg/dL (ref 8.9–10.3)
Chloride: 91 mmol/L — ABNORMAL LOW (ref 98–111)
Creatinine, Ser: 0.71 mg/dL (ref 0.44–1.00)
GFR calc Af Amer: >60 mL/min (ref >60)
GFR calc non Af Amer: >60 mL/min (ref >60)
Glucose, Bld: 168 mg/dL — ABNORMAL HIGH (ref 70–99)
Potassium: 3.8 mmol/L (ref 3.5–5.1)
Sodium: 138 mmol/L (ref 135–145)

## 2020-01-07 NOTE — Progress Notes (Addendum)
Pulmonary Critical Care Medicine [facility] [place]   PULMONARY CRITICAL CARE SERVICE  PROGRESS NOTE  Date of Service: 01/07/2020  Kelly Dawson  GUR:[id number]  DOB: 06-22-1951   DOA: 12/10/2019  Referring Physician: Carron Curie, MD  HPI: Kelly Dawson is a 69 y.o. female seen for follow up of Acute on Chronic Respiratory Failure.  Patient continues on pressure support at this time requiring 30% FiO2.  Medications: Reviewed on Rounds  Physical Exam:  Vitals: Pulse 93 respirations 24 BP 161/67 O2 sat 97% temp 98.6  Ventilator Settings pressure support 16/5 and FiO2 30%  . General: Comfortable at this time . Eyes: Grossly normal lids, irises & conjunctiva . ENT: grossly tongue is normal . Neck: no obvious mass . Cardiovascular: S1 S2 normal no gallop . Respiratory: No rales or rhonchi noted . Abdomen: soft . Skin: no rash seen on limited exam . Musculoskeletal: not rigid . Psychiatric:unable to assess . Neurologic: no seizure no involuntary movements         Lab Data:   Basic Metabolic Panel: Recent Labs  Lab 01/01/20 0654 01/02/20 0502 01/05/20 0510 01/07/20 0709  NA 138  --  143 138  K 3.4* 4.0 4.9 3.8  CL 90*  --  87* 91*  CO2 39*  --  41* 38*  GLUCOSE 135*  --  233* 168*  BUN 28*  --  31* 32*  CREATININE 0.69  --  0.65 0.71  CALCIUM 9.7  --  10.9* 9.7  MG 2.2  --  2.2  --   PHOS 3.9  --   --   --     ABG: No results for input(s): PHART, PCO2ART, PO2ART, HCO3, O2SAT in the last 168 hours.  Liver Function Tests: No results for input(s): AST, ALT, ALKPHOS, BILITOT, PROT, ALBUMIN in the last 168 hours. No results for input(s): LIPASE, AMYLASE in the last 168 hours. No results for input(s): AMMONIA in the last 168 hours.  CBC: Recent Labs  Lab 01/01/20 0654 01/05/20 0510  WBC 4.2 8.3  HGB 9.7* 11.0*  HCT 33.5* 39.7  MCV 93.8 100.3*  PLT 227 278    Cardiac Enzymes: No results for input(s): CKTOTAL, CKMB, CKMBINDEX, TROPONINI in  the last 168 hours.  BNP (last 3 results) No results for input(s): BNP in the last 8760 hours.  ProBNP (last 3 results) No results for input(s): PROBNP in the last 8760 hours.  Radiological Exams: No results found.  Assessment/Plan Active Problems:   Acute on chronic respiratory failure with hypoxia (HCC)   COVID-19 virus infection   Myasthenia gravis (HCC)   Multifocal pneumonia   Pneumonia due to COVID-19 virus   Critical illness polyneuropathy (HCC)   1. Acute on chronic respiratory failure hypoxia resting comfortably at this time on pressure support will continue aggressive pulmonary toilet supportive measures.  Continue to wean as patient tolerate. 2. Myasthenia gravis as discussed above may need medication adjustment will discuss with primary care team 3. COVID-19 virus infection has been treated we will continue with supportive care 4. Multifocal pneumonia treated follow radiologically 5. Critical illness polyneuropathy we will continue with present management   I have personally seen and evaluated the patient, evaluated laboratory and imaging results, formulated the assessment and plan and placed orders. The Patient requires high complexity decision making with multiple systems involvement.  Rounds were done with the Respiratory Therapy Director and Staff therapists and discussed with nursing staff also.  Yevonne Pax, MD [facility] Pulmonary Critical  Care Medicine Sleep Medicine

## 2020-01-08 DIAGNOSIS — G6281 Critical illness polyneuropathy: Secondary | ICD-10-CM | POA: Diagnosis not present

## 2020-01-08 DIAGNOSIS — J9621 Acute and chronic respiratory failure with hypoxia: Secondary | ICD-10-CM | POA: Diagnosis not present

## 2020-01-08 DIAGNOSIS — J189 Pneumonia, unspecified organism: Secondary | ICD-10-CM | POA: Diagnosis not present

## 2020-01-08 DIAGNOSIS — U071 COVID-19: Secondary | ICD-10-CM | POA: Diagnosis not present

## 2020-01-08 NOTE — Progress Notes (Addendum)
Pulmonary Critical Care Medicine [facility] [place]   PULMONARY CRITICAL CARE SERVICE  PROGRESS NOTE  Date of Service: 01/08/2020  Kelly Dawson  ZOX:[id number]  DOB: 1951-05-22   DOA: 12/10/2019  Referring Physician: Carron Curie, MD  HPI: Kelly Dawson is a 69 y.o. female seen for follow up of Acute on Chronic Respiratory Failure.  Patient remains on pressure support at this time 16/5 and FiO2 30% satting well no distress.  Medications: Reviewed on Rounds  Physical Exam:  Vitals: Pulse 85 respirations 19 BP 157/71 O2 sat 99% temp 98.4  Ventilator Settings pressure support 16/5 30% FiO2  . General: Comfortable at this time . Eyes: Grossly normal lids, irises & conjunctiva . ENT: grossly tongue is normal . Neck: no obvious mass . Cardiovascular: S1 S2 normal no gallop . Respiratory: No rales or rhonchi noted . Abdomen: soft . Skin: no rash seen on limited exam . Musculoskeletal: not rigid . Psychiatric:unable to assess . Neurologic: no seizure no involuntary movements         Lab Data:   Basic Metabolic Panel: Recent Labs  Lab 01/02/20 0502 01/05/20 0510 01/07/20 0709  NA  --  143 138  K 4.0 4.9 3.8  CL  --  87* 91*  CO2  --  41* 38*  GLUCOSE  --  233* 168*  BUN  --  31* 32*  CREATININE  --  0.65 0.71  CALCIUM  --  10.9* 9.7  MG  --  2.2  --     ABG: No results for input(s): PHART, PCO2ART, PO2ART, HCO3, O2SAT in the last 168 hours.  Liver Function Tests: No results for input(s): AST, ALT, ALKPHOS, BILITOT, PROT, ALBUMIN in the last 168 hours. No results for input(s): LIPASE, AMYLASE in the last 168 hours. No results for input(s): AMMONIA in the last 168 hours.  CBC: Recent Labs  Lab 01/05/20 0510  WBC 8.3  HGB 11.0*  HCT 39.7  MCV 100.3*  PLT 278    Cardiac Enzymes: No results for input(s): CKTOTAL, CKMB, CKMBINDEX, TROPONINI in the last 168 hours.  BNP (last 3 results) No results for input(s): BNP in the last 8760  hours.  ProBNP (last 3 results) No results for input(s): PROBNP in the last 8760 hours.  Radiological Exams: No results found.  Assessment/Plan Active Problems:   Acute on chronic respiratory failure with hypoxia (HCC)   COVID-19 virus infection   Myasthenia gravis (HCC)   Multifocal pneumonia   Pneumonia due to COVID-19 virus   Critical illness polyneuropathy (HCC)   1. Acute on chronic respiratory failure hypoxia resting comfortably at this time on pressure support will continue aggressive pulmonary toilet supportive measures.  Continue to wean as patient tolerate. 2. Myasthenia gravis as discussed above may need medication adjustment will discuss with primary care team 3. COVID-19 virus infection has been treated we will continue with supportive care 4. Multifocal pneumonia treated follow radiologically 5. Critical illness polyneuropathy we will continue with present management   I have personally seen and evaluated the patient, evaluated laboratory and imaging results, formulated the assessment and plan and placed orders. The Patient requires high complexity decision making with multiple systems involvement.  Rounds were done with the Respiratory Therapy Director and Staff therapists and discussed with nursing staff also.  Yevonne Pax, MD [facility] Pulmonary Critical Care Medicine Sleep Medicine

## 2020-01-08 NOTE — Consult Note (Signed)
Ref: McBroom, [place] S, MD   Subjective:  Awake afebrile. Feels better post tid dose of mestinon.  BP control improving with losartan use. No episode of high grade AV block in 24 hours.  Objective:  Vital Signs in the last 24 hours:  P: 80, R: 24, BP:164/78  Physical Exam: BP Readings from Last 1 Encounters:  No data found for BP     Wt Readings from Last 1 Encounters:  No data found for Wt    Weight change:  There is no height or weight on file to calculate BMI. HEENT: [place]/AT, Eyes-Blue, PERL, EOMI, Conjunctiva-Pink, Sclera-Non-icteric Neck: No JVD, No bruit, Trachea midline. Lungs:  Clear, Bilateral. Cardiac:  Regular rhythm, normal S1 and S2, no S3. II/VI systolic murmur. Abdomen:  Soft, non-tender. BS present. Extremities:  No edema present. No cyanosis. No clubbing. CNS: AxOx3, Cranial nerves grossly intact, moves all 4 extremities.  Skin: Warm and dry.   Intake/Output from previous day: No intake/output data recorded.    Lab Results: BMET    Component Value Date/Time   NA 138 01/07/2020 0709   NA 143 01/05/2020 0510   NA 138 01/01/2020 0654   K 3.8 01/07/2020 0709   K 4.9 01/05/2020 0510   K 4.0 01/02/2020 0502   CL 91 (L) 01/07/2020 0709   CL 87 (L) 01/05/2020 0510   CL 90 (L) 01/01/2020 0654   CO2 38 (H) 01/07/2020 0709   CO2 41 (H) 01/05/2020 0510   CO2 39 (H) 01/01/2020 0654   GLUCOSE 168 (H) 01/07/2020 0709   GLUCOSE 233 (H) 01/05/2020 0510   GLUCOSE 135 (H) 01/01/2020 0654   BUN 32 (H) 01/07/2020 0709   BUN 31 (H) 01/05/2020 0510   BUN 28 (H) 01/01/2020 0654   CREATININE 0.71 01/07/2020 0709   CREATININE 0.65 01/05/2020 0510   CREATININE 0.69 01/01/2020 0654   CALCIUM 9.7 01/07/2020 0709   CALCIUM 10.9 (H) 01/05/2020 0510   CALCIUM 9.7 01/01/2020 0654   GFRNONAA >60 01/07/2020 0709   GFRNONAA >60 01/05/2020 0510   GFRNONAA >60 01/01/2020 0654   GFRAA >60 01/07/2020 0709   GFRAA >60 01/05/2020 0510   GFRAA >60 01/01/2020 0654   CBC     Component Value Date/Time   WBC 8.3 01/05/2020 0510   RBC 3.96 01/05/2020 0510   HGB 11.0 (L) 01/05/2020 0510   HCT 39.7 01/05/2020 0510   PLT 278 01/05/2020 0510   MCV 100.3 (H) 01/05/2020 0510   MCH 27.8 01/05/2020 0510   MCHC 27.7 (L) 01/05/2020 0510   RDW 17.2 (H) 01/05/2020 0510   LYMPHSABS 1.3 12/11/2019 0618   MONOABS 0.4 12/11/2019 0618   EOSABS 0.1 12/11/2019 0618   BASOSABS 0.0 12/11/2019 0618   HEPATIC Function Panel Recent Labs    12/11/19 0618  PROT 6.2*   HEMOGLOBIN A1C No components found for: HGA1C,  MPG CARDIAC ENZYMES Lab Results  Component Value Date   CKTOTAL 14 (L) 12/21/2019   CKMB 1.2 12/21/2019   BNP No results for input(s): PROBNP in the last 8760 hours. TSH Recent Labs    12/11/19 0618  TSH 4.042   CHOLESTEROL No results for input(s): CHOL in the last 8760 hours.  Scheduled Meds: Continuous Infusions: PRN Meds:.  Assessment/Plan: Paroxysmal high grade AV block S/P COVID pneumonia Acute on chronic respiratory failure with hypoxemia Type 2 DM Polyneuropathy H/O diastolic left heart failure H/O tobacco use disorder  Increase Losartan to 100 mg. Daily. Consider lower dose Clonidine in future if  needed,   LOS: 0 days   Time spent including chart review, lab review, examination, discussion with patient and PA : 30 min   Dixie Dials  MD  01/08/2020, 11:46 AM

## 2020-01-09 DIAGNOSIS — G6281 Critical illness polyneuropathy: Secondary | ICD-10-CM | POA: Diagnosis not present

## 2020-01-09 DIAGNOSIS — J189 Pneumonia, unspecified organism: Secondary | ICD-10-CM | POA: Diagnosis not present

## 2020-01-09 DIAGNOSIS — U071 COVID-19: Secondary | ICD-10-CM | POA: Diagnosis not present

## 2020-01-09 DIAGNOSIS — J9621 Acute and chronic respiratory failure with hypoxia: Secondary | ICD-10-CM | POA: Diagnosis not present

## 2020-01-09 NOTE — Consult Note (Signed)
Ref: McBroom, [place] S, MD   Subjective:  Awake and afebrile.  No episode of prolonged high grade AV block in 72 hours.  Objective:  Vital Signs in the last 24 hours:  T- 97.2 degree F, P: 79, R: 19, BP 144/84, O2 sat 100 %.  Physical Exam: BP Readings from Last 1 Encounters:  No data found for BP     Wt Readings from Last 1 Encounters:  No data found for Wt    Weight change:  There is no height or weight on file to calculate BMI. HEENT: [place]/AT, Eyes-Blue, PERL, EOMI, Conjunctiva-Pink, Sclera-Non-icteric Neck: No JVD, No bruit, Trachea midline. Lungs:  Clear, Bilateral. Cardiac:  Regular rhythm, normal S1 and S2, no S3. II/VI systolic murmur. Abdomen:  Soft, non-tender. BS present. Extremities:  No edema present. No cyanosis. No clubbing. CNS: AxOx3, Cranial nerves grossly intact, moves all 4 extremities.  Skin: Warm and dry.   Intake/Output from previous day: No intake/output data recorded.    Lab Results: BMET    Component Value Date/Time   NA 138 01/07/2020 0709   NA 143 01/05/2020 0510   NA 138 01/01/2020 0654   K 3.8 01/07/2020 0709   K 4.9 01/05/2020 0510   K 4.0 01/02/2020 0502   CL 91 (L) 01/07/2020 0709   CL 87 (L) 01/05/2020 0510   CL 90 (L) 01/01/2020 0654   CO2 38 (H) 01/07/2020 0709   CO2 41 (H) 01/05/2020 0510   CO2 39 (H) 01/01/2020 0654   GLUCOSE 168 (H) 01/07/2020 0709   GLUCOSE 233 (H) 01/05/2020 0510   GLUCOSE 135 (H) 01/01/2020 0654   BUN 32 (H) 01/07/2020 0709   BUN 31 (H) 01/05/2020 0510   BUN 28 (H) 01/01/2020 0654   CREATININE 0.71 01/07/2020 0709   CREATININE 0.65 01/05/2020 0510   CREATININE 0.69 01/01/2020 0654   CALCIUM 9.7 01/07/2020 0709   CALCIUM 10.9 (H) 01/05/2020 0510   CALCIUM 9.7 01/01/2020 0654   GFRNONAA >60 01/07/2020 0709   GFRNONAA >60 01/05/2020 0510   GFRNONAA >60 01/01/2020 0654   GFRAA >60 01/07/2020 0709   GFRAA >60 01/05/2020 0510   GFRAA >60 01/01/2020 0654   CBC    Component Value Date/Time   WBC  8.3 01/05/2020 0510   RBC 3.96 01/05/2020 0510   HGB 11.0 (L) 01/05/2020 0510   HCT 39.7 01/05/2020 0510   PLT 278 01/05/2020 0510   MCV 100.3 (H) 01/05/2020 0510   MCH 27.8 01/05/2020 0510   MCHC 27.7 (L) 01/05/2020 0510   RDW 17.2 (H) 01/05/2020 0510   LYMPHSABS 1.3 12/11/2019 0618   MONOABS 0.4 12/11/2019 0618   EOSABS 0.1 12/11/2019 0618   BASOSABS 0.0 12/11/2019 0618   HEPATIC Function Panel Recent Labs    12/11/19 0618  PROT 6.2*   HEMOGLOBIN A1C No components found for: HGA1C,  MPG CARDIAC ENZYMES Lab Results  Component Value Date   CKTOTAL 14 (L) 12/21/2019   CKMB 1.2 12/21/2019   BNP No results for input(s): PROBNP in the last 8760 hours. TSH Recent Labs    12/11/19 0618  TSH 4.042   CHOLESTEROL No results for input(s): CHOL in the last 8760 hours.  Scheduled Meds: Continuous Infusions: PRN Meds:.  Assessment/Plan: Paroxysmal high grade Av block S/P COVID pneumonia Acute on chronic respiratory failure with hypoxemia Type 2 DM with polyneuropathy Myasthenia gravis H/O diastolic left heart failure  Continue medical treatment.   LOS: 0 days   Time spent including chart review, lab  review, examination, discussion with patient and nurse/PA : 30 min   Dixie Dials  MD  01/09/2020, 9:33 PM

## 2020-01-09 NOTE — Progress Notes (Signed)
Pulmonary Critical Care Medicine [facility] [place]   PULMONARY CRITICAL CARE SERVICE  PROGRESS NOTE  Date of Service: 01/09/2020  Kelly Dawson  ONG:[id number]  DOB: [birth year]   DOA: 12/10/2019  Referring Physician: Carron Curie, MD  HPI: Kelly Dawson is a 69 y.o. female seen for follow up of Acute on Chronic Respiratory Failure.  Patient currently is on the NAG has been doing well right now is requiring 3 L of oxygen.  She is feeling better regarding the weakness after increasing the dosage of the Mestinon.  Today she is actually also weaning and doing well.  We are going to need to monitor her quite closely for any further episodes of bradycardia  Medications: Reviewed on Rounds  Physical Exam:  Vitals: Temperature 97.2 pulse 79 respiratory rate 19 blood pressure is 144/84 saturations 100%  Ventilator Settings off the ventilator on the NAG currently on 3 L  . General: Comfortable at this time . Eyes: Grossly normal lids, irises & conjunctiva . ENT: grossly tongue is normal . Neck: no obvious mass . Cardiovascular: S1 S2 normal no gallop . Respiratory: Coarse breath sounds scattered rhonchi . Abdomen: soft . Skin: no rash seen on limited exam . Musculoskeletal: not rigid . Psychiatric:unable to assess . Neurologic: no seizure no involuntary movements         Lab Data:   Basic Metabolic Panel: Recent Labs  Lab 01/05/20 0510 01/07/20 0709  NA 143 138  K 4.9 3.8  CL 87* 91*  CO2 41* 38*  GLUCOSE 233* 168*  BUN 31* 32*  CREATININE 0.65 0.71  CALCIUM 10.9* 9.7  MG 2.2  --     ABG: No results for input(s): PHART, PCO2ART, PO2ART, HCO3, O2SAT in the last 168 hours.  Liver Function Tests: No results for input(s): AST, ALT, ALKPHOS, BILITOT, PROT, ALBUMIN in the last 168 hours. No results for input(s): LIPASE, AMYLASE in the last 168 hours. No results for input(s): AMMONIA in the last 168 hours.  CBC: Recent Labs  Lab 01/05/20 0510  WBC 8.3   HGB 11.0*  HCT 39.7  MCV 100.3*  PLT 278    Cardiac Enzymes: No results for input(s): CKTOTAL, CKMB, CKMBINDEX, TROPONINI in the last 168 hours.  BNP (last 3 results) No results for input(s): BNP in the last 8760 hours.  ProBNP (last 3 results) No results for input(s): PROBNP in the last 8760 hours.  Radiological Exams: No results found.  Assessment/Plan Active Problems:   Acute on chronic respiratory failure with hypoxia (HCC)   COVID-19 virus infection   Myasthenia gravis (HCC)   Multifocal pneumonia   Pneumonia due to COVID-19 virus   Critical illness polyneuropathy (HCC)   1. Acute on chronic respiratory failure with hypoxia continue with weaning on the NAG continue secretion management supportive care 2. COVID-19 virus infection now is in resolution phase 3. Myasthenia gravis treated clinically better now since increasing dosage of her medications 4. Multifocal pneumonia treated clinically improving 5. Pneumonia due to COVID-19 as above treated 6. Critical illness polyneuropathy will need ongoing physical therapy   I have personally seen and evaluated the patient, evaluated laboratory and imaging results, formulated the assessment and plan and placed orders. The Patient requires high complexity decision making with multiple systems involvement.  Rounds were done with the Respiratory Therapy Director and Staff therapists and discussed with nursing staff also.  Yevonne Pax, MD [facility] Pulmonary Critical Care Medicine Sleep Medicine

## 2020-01-10 ENCOUNTER — Other Ambulatory Visit (HOSPITAL_COMMUNITY): Payer: Medicare Other

## 2020-01-10 DIAGNOSIS — J9621 Acute and chronic respiratory failure with hypoxia: Secondary | ICD-10-CM | POA: Diagnosis not present

## 2020-01-10 DIAGNOSIS — J189 Pneumonia, unspecified organism: Secondary | ICD-10-CM | POA: Diagnosis not present

## 2020-01-10 DIAGNOSIS — G6281 Critical illness polyneuropathy: Secondary | ICD-10-CM | POA: Diagnosis not present

## 2020-01-10 DIAGNOSIS — U071 COVID-19: Secondary | ICD-10-CM | POA: Diagnosis not present

## 2020-01-10 LAB — BASIC METABOLIC PANEL
Anion gap: 12 (ref 5–15)
BUN: 31 mg/dL — ABNORMAL HIGH (ref 8–23)
CO2: 36 mmol/L — ABNORMAL HIGH (ref 22–32)
Calcium: 10 mg/dL (ref 8.9–10.3)
Chloride: 91 mmol/L — ABNORMAL LOW (ref 98–111)
Creatinine, Ser: 0.64 mg/dL (ref 0.44–1.00)
GFR calc Af Amer: >60 mL/min (ref >60)
GFR calc non Af Amer: >60 mL/min (ref >60)
Glucose, Bld: 158 mg/dL — ABNORMAL HIGH (ref 70–99)
Potassium: 3.8 mmol/L (ref 3.5–5.1)
Sodium: 139 mmol/L (ref 135–145)

## 2020-01-10 LAB — CBC
HCT: 36.6 % (ref 36.0–46.0)
Hemoglobin: 10.7 g/dL — ABNORMAL LOW (ref 12.0–15.0)
MCH: 28.2 pg (ref 26.0–34.0)
MCHC: 29.2 g/dL — ABNORMAL LOW (ref 30.0–36.0)
MCV: 96.3 fL (ref 80.0–100.0)
Platelets: 201 10*3/uL (ref 150–400)
RBC: 3.8 MIL/uL — ABNORMAL LOW (ref 3.87–5.11)
RDW: 17.7 % — ABNORMAL HIGH (ref 11.5–15.5)
WBC: 6.4 10*3/uL (ref 4.0–10.5)
nRBC: 0 % (ref 0.0–0.2)

## 2020-01-10 LAB — MAGNESIUM: Magnesium: 2.1 mg/dL (ref 1.7–2.4)

## 2020-01-10 LAB — PHOSPHORUS: Phosphorus: 4.7 mg/dL — ABNORMAL HIGH (ref 2.5–4.6)

## 2020-01-10 NOTE — Progress Notes (Signed)
Pulmonary Critical Care Medicine [facility] [place]   PULMONARY CRITICAL CARE SERVICE  PROGRESS NOTE  Date of Service: 01/10/2020  Kelly Dawson  IRC:[id number]  DOB: [birth year]   DOA: 12/10/2019  Referring Physician: Carron Curie, MD  HPI: Kelly Dawson is a 69 y.o. female seen for follow up of Acute on Chronic Respiratory Failure.  Patient is off the ventilator on the NAG today will be 24 hours she is actually want to stay off of the ventilator overnight and she was able to do it successfully.  Medications: Reviewed on Rounds  Physical Exam:  Vitals: Temperature 97.0 pulse 104 respiratory rate 22 blood pressure is 157/66  Ventilator Settings off the ventilator on the ABG  . General: Comfortable at this time . Eyes: Grossly normal lids, irises & conjunctiva . ENT: grossly tongue is normal . Neck: no obvious mass . Cardiovascular: S1 S2 normal no gallop . Respiratory: No rhonchi no rales are noted at this time . Abdomen: soft . Skin: no rash seen on limited exam . Musculoskeletal: not rigid . Psychiatric:unable to assess . Neurologic: no seizure no involuntary movements         Lab Data:   Basic Metabolic Panel: Recent Labs  Lab 01/05/20 0510 01/07/20 0709 01/10/20 0709  NA 143 138 139  K 4.9 3.8 3.8  CL 87* 91* 91*  CO2 41* 38* 36*  GLUCOSE 233* 168* 158*  BUN 31* 32* 31*  CREATININE 0.65 0.71 0.64  CALCIUM 10.9* 9.7 10.0  MG 2.2  --  2.1  PHOS  --   --  4.7*    ABG: No results for input(s): PHART, PCO2ART, PO2ART, HCO3, O2SAT in the last 168 hours.  Liver Function Tests: No results for input(s): AST, ALT, ALKPHOS, BILITOT, PROT, ALBUMIN in the last 168 hours. No results for input(s): LIPASE, AMYLASE in the last 168 hours. No results for input(s): AMMONIA in the last 168 hours.  CBC: Recent Labs  Lab 01/05/20 0510 01/10/20 0709  WBC 8.3 6.4  HGB 11.0* 10.7*  HCT 39.7 36.6  MCV 100.3* 96.3  PLT 278 201    Cardiac Enzymes: No  results for input(s): CKTOTAL, CKMB, CKMBINDEX, TROPONINI in the last 168 hours.  BNP (last 3 results) No results for input(s): BNP in the last 8760 hours.  ProBNP (last 3 results) No results for input(s): PROBNP in the last 8760 hours.  Radiological Exams: DG CHEST PORT 1 VIEW  Result Date: 01/10/2020 CLINICAL DATA:  Pneumonia EXAM: PORTABLE CHEST 1 VIEW COMPARISON:  Eight days ago FINDINGS: Tracheostomy tube remains in place. Cardiomegaly and asymmetric elevation of the right diaphragm. Lung volumes are low and there is indistinct opacities at the bases. No visible effusion or pneumothorax. IMPRESSION: 1. Low volume chest with asymmetric elevation of the right diaphragm. 2. Persistent opacities at the bases could be atelectasis, scarring, or infection. Electronically Signed   By: Marnee Spring M.D.   On: 01/10/2020 06:45    Assessment/Plan Active Problems:   Acute on chronic respiratory failure with hypoxia (HCC)   COVID-19 virus infection   Myasthenia gravis (HCC)   Multifocal pneumonia   Pneumonia due to COVID-19 virus   Critical illness polyneuropathy (HCC)   1. Acute on chronic respiratory failure with hypoxia she is doing well plan is to continue to advance the weaning as tolerated.  Hopefully we should be able to start capping trials soon 2. COVID-19 virus infection treated we will continue with present management 3. Myasthenia gravis improved  4. Multifocal pneumonia treated clinically is improving 5. Bradycardia seems to be doing better but we need to continue to monitor 6. Critical illness polyneuropathy will continue with therapy as tolerated   I have personally seen and evaluated the patient, evaluated laboratory and imaging results, formulated the assessment and plan and placed orders. The Patient requires high complexity decision making with multiple systems involvement.  Rounds were done with the Respiratory Therapy Director and Staff therapists and discussed with  nursing staff also.  Allyne Gee, MD [facility] Pulmonary Critical Care Medicine Sleep Medicine

## 2020-01-11 DIAGNOSIS — J189 Pneumonia, unspecified organism: Secondary | ICD-10-CM | POA: Diagnosis not present

## 2020-01-11 DIAGNOSIS — J9621 Acute and chronic respiratory failure with hypoxia: Secondary | ICD-10-CM | POA: Diagnosis not present

## 2020-01-11 DIAGNOSIS — G6281 Critical illness polyneuropathy: Secondary | ICD-10-CM | POA: Diagnosis not present

## 2020-01-11 DIAGNOSIS — U071 COVID-19: Secondary | ICD-10-CM | POA: Diagnosis not present

## 2020-01-11 NOTE — Progress Notes (Addendum)
Pulmonary Critical Care Medicine [facility] [place]   PULMONARY CRITICAL CARE SERVICE  PROGRESS NOTE  Date of Service: 01/11/2020  Kelly Dawson  FUX:[id number]  DOB: 1950-11-29   DOA: 12/10/2019  Referring Physician: Carron Curie, MD  HPI: Kelly Dawson is a 69 y.o. female seen for follow up of Acute on Chronic Respiratory Failure.  Patient is on 1 L on the NAG has been doing well for 48 hours she is ready to start with capping trials  Medications: Reviewed on Rounds  Physical Exam:  Vitals: Temperature is 97.6 pulse 99 respiratory rate 29 blood pressure is 150/68 saturations 100%  Ventilator Settings off the ventilator on the NAG  . General: Comfortable at this time . Eyes: Grossly normal lids, irises & conjunctiva . ENT: grossly tongue is normal . Neck: no obvious mass . Cardiovascular: S1 S2 normal no gallop . Respiratory: No rhonchi no rales are noted at this time . Abdomen: soft . Skin: no rash seen on limited exam . Musculoskeletal: not rigid . Psychiatric:unable to assess . Neurologic: no seizure no involuntary movements         Lab Data:   Basic Metabolic Panel: Recent Labs  Lab 01/05/20 0510 01/07/20 0709 01/10/20 0709  NA 143 138 139  K 4.9 3.8 3.8  CL 87* 91* 91*  CO2 41* 38* 36*  GLUCOSE 233* 168* 158*  BUN 31* 32* 31*  CREATININE 0.65 0.71 0.64  CALCIUM 10.9* 9.7 10.0  MG 2.2  --  2.1  PHOS  --   --  4.7*    ABG: No results for input(s): PHART, PCO2ART, PO2ART, HCO3, O2SAT in the last 168 hours.  Liver Function Tests: No results for input(s): AST, ALT, ALKPHOS, BILITOT, PROT, ALBUMIN in the last 168 hours. No results for input(s): LIPASE, AMYLASE in the last 168 hours. No results for input(s): AMMONIA in the last 168 hours.  CBC: Recent Labs  Lab 01/05/20 0510 01/10/20 0709  WBC 8.3 6.4  HGB 11.0* 10.7*  HCT 39.7 36.6  MCV 100.3* 96.3  PLT 278 201    Cardiac Enzymes: No results for input(s): CKTOTAL, CKMB,  CKMBINDEX, TROPONINI in the last 168 hours.  BNP (last 3 results) No results for input(s): BNP in the last 8760 hours.  ProBNP (last 3 results) No results for input(s): PROBNP in the last 8760 hours.  Radiological Exams: DG CHEST PORT 1 VIEW  Result Date: 01/10/2020 CLINICAL DATA:  Pneumonia EXAM: PORTABLE CHEST 1 VIEW COMPARISON:  Eight days ago FINDINGS: Tracheostomy tube remains in place. Cardiomegaly and asymmetric elevation of the right diaphragm. Lung volumes are low and there is indistinct opacities at the bases. No visible effusion or pneumothorax. IMPRESSION: 1. Low volume chest with asymmetric elevation of the right diaphragm. 2. Persistent opacities at the bases could be atelectasis, scarring, or infection. Electronically Signed   By: Marnee Spring M.D.   On: 01/10/2020 06:45    Assessment/Plan Active Problems:   Acute on chronic respiratory failure with hypoxia (HCC)   COVID-19 virus infection   Myasthenia gravis (HCC)   Multifocal pneumonia   Pneumonia due to COVID-19 virus   Critical illness polyneuropathy (HCC)   1. Acute on chronic respiratory failure with hypoxia plan is to continue with the NAG 48-hour completed.  At that point begin with capping trials. 2. COVID-19 virus infection in resolution phase 3. Myasthenia gravis clinically improved we will continue with supportive care 4. Multifocal pneumonia x-ray results as above 5. COVID-19 pneumonia improving  slowly 6. Critical illness polyneuropathy we will continue with therapy as tolerated   I have personally seen and evaluated the patient, evaluated laboratory and imaging results, formulated the assessment and plan and placed orders. The Patient requires high complexity decision making with multiple systems involvement.  Rounds were done with the Respiratory Therapy Director and Staff therapists and discussed with nursing staff also.  Allyne Gee, MD [facility] Pulmonary Critical Care Medicine Sleep Medicine

## 2020-01-12 DIAGNOSIS — J9621 Acute and chronic respiratory failure with hypoxia: Secondary | ICD-10-CM | POA: Diagnosis not present

## 2020-01-12 DIAGNOSIS — G6281 Critical illness polyneuropathy: Secondary | ICD-10-CM | POA: Diagnosis not present

## 2020-01-12 DIAGNOSIS — J189 Pneumonia, unspecified organism: Secondary | ICD-10-CM | POA: Diagnosis not present

## 2020-01-12 DIAGNOSIS — U071 COVID-19: Secondary | ICD-10-CM | POA: Diagnosis not present

## 2020-01-12 NOTE — Consult Note (Signed)
Ref: McBroom, [place] S, MD  Subjective:  Feeling better. 95 % oxygen saturation with 1L oxygen and capped tracheostomy tube.  Objective:  Vital Signs in the last 24 hours:  P: 79, R: 20, BP: 180/72  Physical Exam: BP Readings from Last 1 Encounters:  No data found for BP     Wt Readings from Last 1 Encounters:  No data found for Wt    Weight change:  There is no height or weight on file to calculate BMI. HEENT: [place]/AT, Eyes-Blue, PERL, EOMI, Conjunctiva-Pink, Sclera-Non-icteric Neck: No JVD, No bruit, Trachea midline. Lungs:  Clear, Bilateral. Cardiac:  Regular rhythm, normal S1 and S2, no S3. II/VI systolic murmur. Abdomen:  Soft, non-tender. BS present. Extremities:  No edema present. No cyanosis. No clubbing. CNS: AxOx3, Cranial nerves grossly intact, moves all 4 extremities.  Skin: Warm and dry.   Intake/Output from previous day: No intake/output data recorded.    Lab Results: BMET    Component Value Date/Time   NA 139 01/10/2020 0709   NA 138 01/07/2020 0709   NA 143 01/05/2020 0510   K 3.8 01/10/2020 0709   K 3.8 01/07/2020 0709   K 4.9 01/05/2020 0510   CL 91 (L) 01/10/2020 0709   CL 91 (L) 01/07/2020 0709   CL 87 (L) 01/05/2020 0510   CO2 36 (H) 01/10/2020 0709   CO2 38 (H) 01/07/2020 0709   CO2 41 (H) 01/05/2020 0510   GLUCOSE 158 (H) 01/10/2020 0709   GLUCOSE 168 (H) 01/07/2020 0709   GLUCOSE 233 (H) 01/05/2020 0510   BUN 31 (H) 01/10/2020 0709   BUN 32 (H) 01/07/2020 0709   BUN 31 (H) 01/05/2020 0510   CREATININE 0.64 01/10/2020 0709   CREATININE 0.71 01/07/2020 0709   CREATININE 0.65 01/05/2020 0510   CALCIUM 10.0 01/10/2020 0709   CALCIUM 9.7 01/07/2020 0709   CALCIUM 10.9 (H) 01/05/2020 0510   GFRNONAA >60 01/10/2020 0709   GFRNONAA >60 01/07/2020 0709   GFRNONAA >60 01/05/2020 0510   GFRAA >60 01/10/2020 0709   GFRAA >60 01/07/2020 0709   GFRAA >60 01/05/2020 0510   CBC    Component Value Date/Time   WBC 6.4 01/10/2020 0709   RBC  3.80 (L) 01/10/2020 0709   HGB 10.7 (L) 01/10/2020 0709   HCT 36.6 01/10/2020 0709   PLT 201 01/10/2020 0709   MCV 96.3 01/10/2020 0709   MCH 28.2 01/10/2020 0709   MCHC 29.2 (L) 01/10/2020 0709   RDW 17.7 (H) 01/10/2020 0709   LYMPHSABS 1.3 12/11/2019 0618   MONOABS 0.4 12/11/2019 0618   EOSABS 0.1 12/11/2019 0618   BASOSABS 0.0 12/11/2019 0618   HEPATIC Function Panel Recent Labs    12/11/19 0618  PROT 6.2*   HEMOGLOBIN A1C No components found for: HGA1C,  MPG CARDIAC ENZYMES Lab Results  Component Value Date   CKTOTAL 14 (L) 12/21/2019   CKMB 1.2 12/21/2019   BNP No results for input(s): PROBNP in the last 8760 hours. TSH Recent Labs    12/11/19 0618  TSH 4.042   CHOLESTEROL No results for input(s): CHOL in the last 8760 hours.  Scheduled Meds: Continuous Infusions: PRN Meds:.  Assessment/Plan: Paroxysmal high grade AV block S/P COVID pneumonia Acute on chronic respiratory failure with hypoxemia Type 2 DM with polyneuropathy Myasthenia gravis H/O diastolic left heart failure  Continue medical treatment. Stable for SNF and possible home soon.   LOS: 0 days   Time spent including chart review, lab review, examination, discussion with  patient :  min   Dixie Dials  MD  01/12/2020, 9:26 PM

## 2020-01-12 NOTE — Progress Notes (Signed)
Pulmonary Critical Care Medicine [facility] [place]   PULMONARY CRITICAL CARE SERVICE  PROGRESS NOTE  Date of Service: 01/12/2020  Kelly Dawson  DGL:[id number]  DOB: Feb 16, 1951   DOA: 12/10/2019  Referring Physician: Carron Curie, MD  HPI: Kelly Dawson is a 69 y.o. female seen for follow up of Acute on Chronic Respiratory Failure.  Patient is capping right now using about 1 to 2L today will be 24 hours  Medications: Reviewed on Rounds  Physical Exam:  Vitals: Temperature 98.6 pulse 79 respiratory rate 20 blood pressure is 185/72 saturations 95%  Ventilator Settings capping off the ventilator on 2 L oxygen  . General: Comfortable at this time . Eyes: Grossly normal lids, irises & conjunctiva . ENT: grossly tongue is normal . Neck: no obvious mass . Cardiovascular: S1 S2 normal no gallop . Respiratory: No rhonchi coarse breath sounds . Abdomen: soft . Skin: no rash seen on limited exam . Musculoskeletal: not rigid . Psychiatric:unable to assess . Neurologic: no seizure no involuntary movements         Lab Data:   Basic Metabolic Panel: Recent Labs  Lab 01/07/20 0709 01/10/20 0709  NA 138 139  K 3.8 3.8  CL 91* 91*  CO2 38* 36*  GLUCOSE 168* 158*  BUN 32* 31*  CREATININE 0.71 0.64  CALCIUM 9.7 10.0  MG  --  2.1  PHOS  --  4.7*    ABG: No results for input(s): PHART, PCO2ART, PO2ART, HCO3, O2SAT in the last 168 hours.  Liver Function Tests: No results for input(s): AST, ALT, ALKPHOS, BILITOT, PROT, ALBUMIN in the last 168 hours. No results for input(s): LIPASE, AMYLASE in the last 168 hours. No results for input(s): AMMONIA in the last 168 hours.  CBC: Recent Labs  Lab 01/10/20 0709  WBC 6.4  HGB 10.7*  HCT 36.6  MCV 96.3  PLT 201    Cardiac Enzymes: No results for input(s): CKTOTAL, CKMB, CKMBINDEX, TROPONINI in the last 168 hours.  BNP (last 3 results) No results for input(s): BNP in the last 8760 hours.  ProBNP (last 3  results) No results for input(s): PROBNP in the last 8760 hours.  Radiological Exams: No results found.  Assessment/Plan Active Problems:   Acute on chronic respiratory failure with hypoxia (HCC)   COVID-19 virus infection   Myasthenia gravis (HCC)   Multifocal pneumonia   Pneumonia due to COVID-19 virus   Critical illness polyneuropathy (HCC)   1. Acute on chronic respiratory failure with hypoxia plan is to continue with weaning doing well right now on 2 L will be completing 24-hour capping today hopefully should be able to decannulate either Friday or Saturday 2. COVID-19 virus infection treated resolved 3. Myasthenia gravis much improved after adjustment of medications 4. Multifocal pneumonia improving 5. Bradycardia no further episodes for now 6. Critical illness polyneuropathy improving slowly   I have personally seen and evaluated the patient, evaluated laboratory and imaging results, formulated the assessment and plan and placed orders. The Patient requires high complexity decision making with multiple systems involvement.  Rounds were done with the Respiratory Therapy Director and Staff therapists and discussed with nursing staff also.  Yevonne Pax, MD [facility] Pulmonary Critical Care Medicine Sleep Medicine

## 2020-01-13 ENCOUNTER — Other Ambulatory Visit (HOSPITAL_COMMUNITY): Payer: Medicare Other

## 2020-01-13 DIAGNOSIS — U071 COVID-19: Secondary | ICD-10-CM | POA: Diagnosis not present

## 2020-01-13 DIAGNOSIS — J189 Pneumonia, unspecified organism: Secondary | ICD-10-CM | POA: Diagnosis not present

## 2020-01-13 DIAGNOSIS — J9621 Acute and chronic respiratory failure with hypoxia: Secondary | ICD-10-CM | POA: Diagnosis not present

## 2020-01-13 DIAGNOSIS — G6281 Critical illness polyneuropathy: Secondary | ICD-10-CM | POA: Diagnosis not present

## 2020-01-13 LAB — BASIC METABOLIC PANEL
Anion gap: 13 (ref 5–15)
BUN: 43 mg/dL — ABNORMAL HIGH (ref 8–23)
CO2: 33 mmol/L — ABNORMAL HIGH (ref 22–32)
Calcium: 9.9 mg/dL (ref 8.9–10.3)
Chloride: 93 mmol/L — ABNORMAL LOW (ref 98–111)
Creatinine, Ser: 0.72 mg/dL (ref 0.44–1.00)
GFR calc Af Amer: >60 mL/min (ref >60)
GFR calc non Af Amer: >60 mL/min (ref >60)
Glucose, Bld: 150 mg/dL — ABNORMAL HIGH (ref 70–99)
Potassium: 4.8 mmol/L (ref 3.5–5.1)
Sodium: 139 mmol/L (ref 135–145)

## 2020-01-13 LAB — CBC
HCT: 35.6 % — ABNORMAL LOW (ref 36.0–46.0)
Hemoglobin: 10.2 g/dL — ABNORMAL LOW (ref 12.0–15.0)
MCH: 27.9 pg (ref 26.0–34.0)
MCHC: 28.7 g/dL — ABNORMAL LOW (ref 30.0–36.0)
MCV: 97.5 fL (ref 80.0–100.0)
Platelets: 174 10*3/uL (ref 150–400)
RBC: 3.65 MIL/uL — ABNORMAL LOW (ref 3.87–5.11)
RDW: 17.5 % — ABNORMAL HIGH (ref 11.5–15.5)
WBC: 9.8 10*3/uL (ref 4.0–10.5)
nRBC: 0 % (ref 0.0–0.2)

## 2020-01-13 LAB — BLOOD GAS, ARTERIAL
Acid-Base Excess: 13 mmol/L — ABNORMAL HIGH (ref 0.0–2.0)
Bicarbonate: 38.7 mmol/L — ABNORMAL HIGH (ref 20.0–28.0)
FIO2: 32
O2 Saturation: 93.9 %
Patient temperature: 37
pCO2 arterial: 66.6 mmHg (ref 32.0–48.0)
pH, Arterial: 7.382 (ref 7.350–7.450)
pO2, Arterial: 71 mmHg — ABNORMAL LOW (ref 83.0–108.0)

## 2020-01-13 LAB — PHOSPHORUS: Phosphorus: 3.7 mg/dL (ref 2.5–4.6)

## 2020-01-13 LAB — MAGNESIUM: Magnesium: 2.1 mg/dL (ref 1.7–2.4)

## 2020-01-13 NOTE — Progress Notes (Signed)
Pulmonary Critical Care Medicine [facility] [place]   PULMONARY CRITICAL CARE SERVICE  PROGRESS NOTE  Date of Service: 01/13/2020  Kelly Dawson  [id number]  DOB: 1951/08/17   DOA: 12/10/2019  Referring Physician: Carron Curie, MD  HPI: Kelly Dawson is a 69 y.o. female seen for follow up of Acute on Chronic Respiratory Failure.  She is doing well with capping trials currently is on 2 L of oxygen and has been capping now for 48 hours  Medications: Reviewed on Rounds  Physical Exam:  Vitals: Temperature is 97.7 pulse 99 respiratory rate 27 blood pressure is 144/63 saturations 94%  Ventilator Settings capping off the ventilator  . General: Comfortable at this time . Eyes: Grossly normal lids, irises & conjunctiva . ENT: grossly tongue is normal . Neck: no obvious mass . Cardiovascular: S1 S2 normal no gallop . Respiratory: No rhonchi no rales are noted at this time . Abdomen: soft . Skin: no rash seen on limited exam . Musculoskeletal: not rigid . Psychiatric:unable to assess . Neurologic: no seizure no involuntary movements         Lab Data:   Basic Metabolic Panel: Recent Labs  Lab 01/07/20 0709 01/10/20 0709 01/13/20 0642  NA 138 139 139  K 3.8 3.8 4.8  CL 91* 91* 93*  CO2 38* 36* 33*  GLUCOSE 168* 158* 150*  BUN 32* 31* 43*  CREATININE 0.71 0.64 0.72  CALCIUM 9.7 10.0 9.9  MG  --  2.1 2.1  PHOS  --  4.7* 3.7    ABG: No results for input(s): PHART, PCO2ART, PO2ART, HCO3, O2SAT in the last 168 hours.  Liver Function Tests: No results for input(s): AST, ALT, ALKPHOS, BILITOT, PROT, ALBUMIN in the last 168 hours. No results for input(s): LIPASE, AMYLASE in the last 168 hours. No results for input(s): AMMONIA in the last 168 hours.  CBC: Recent Labs  Lab 01/10/20 0709 01/13/20 0642  WBC 6.4 9.8  HGB 10.7* 10.2*  HCT 36.6 35.6*  MCV 96.3 97.5  PLT 201 174    Cardiac Enzymes: No results for input(s): CKTOTAL, CKMB, CKMBINDEX,  TROPONINI in the last 168 hours.  BNP (last 3 results) No results for input(s): BNP in the last 8760 hours.  ProBNP (last 3 results) No results for input(s): PROBNP in the last 8760 hours.  Radiological Exams: DG CHEST PORT 1 VIEW  Result Date: 01/13/2020 CLINICAL DATA:  Pneumonia. Shortness of breath. EXAM: PORTABLE CHEST 1 VIEW COMPARISON:  01/10/2020 FINDINGS: Tracheostomy tube tip at the thoracic inlet. Stable cardiomegaly and mediastinal contours. Aortic atherosclerosis. Unchanged elevation of the hemidiaphragm. Streaky bibasilar opacities are unchanged. No pneumothorax or large pleural effusion. IMPRESSION: Unchanged bibasilar opacities, may be atelectasis, pneumonia or scarring. No new abnormalities. Electronically Signed   By: Narda Rutherford M.D.   On: 01/13/2020 06:40    Assessment/Plan Active Problems:   Acute on chronic respiratory failure with hypoxia (HCC)   COVID-19 virus infection   Myasthenia gravis (HCC)   Multifocal pneumonia   Pneumonia due to COVID-19 virus   Critical illness polyneuropathy (HCC)   1. Acute on chronic respiratory failure hypoxia plan is to continue with capping trials as ordered titrate oxygen down as tolerated 2. COVID-19 virus infection clinically improving we will continue with supportive care 3. Myasthenia gravis improved 4. Multifocal pneumonia as above improving there is still some opacities noted 5. Pneumonia due to COVID-19 with residual changes on the chest x-ray will need to follow-up after discharge 6. Critical  illness polyneuropathy no change at this time supportive care therapy as tolerated   I have personally seen and evaluated the patient, evaluated laboratory and imaging results, formulated the assessment and plan and placed orders. The Patient requires high complexity decision making with multiple systems involvement.  Rounds were done with the Respiratory Therapy Director and Staff therapists and discussed with nursing staff  also.  Allyne Gee, MD [facility] Pulmonary Critical Care Medicine Sleep Medicine

## 2020-01-14 DIAGNOSIS — G6281 Critical illness polyneuropathy: Secondary | ICD-10-CM | POA: Diagnosis not present

## 2020-01-14 DIAGNOSIS — U071 COVID-19: Secondary | ICD-10-CM | POA: Diagnosis not present

## 2020-01-14 DIAGNOSIS — J9621 Acute and chronic respiratory failure with hypoxia: Secondary | ICD-10-CM | POA: Diagnosis not present

## 2020-01-14 DIAGNOSIS — J189 Pneumonia, unspecified organism: Secondary | ICD-10-CM | POA: Diagnosis not present

## 2020-01-14 NOTE — Progress Notes (Addendum)
Pulmonary Critical Care Medicine [place]   PULMONARY CRITICAL CARE SERVICE  PROGRESS NOTE  Date of Service: 01/14/2020  TRACYANN DUFFELL  [id number]  DOB: 23-May-1951   DOA: 12/10/2019  Referring Physician: Merton Border, MD  HPI: Kelly Dawson is a 69 y.o. female seen for follow up of Acute on Chronic Respiratory Failure.  Patient remains capped at this time on 2 L nasal cannula satting well.  Patient is expected to be discharged home in the coming days.  Medications: Reviewed on Rounds  Physical Exam:  Vitals: Pulse 80 respirations 22 BP 128/61 O2 sat 100% temp 97.6  Ventilator Settings 2 L nasal cannula  . General: Comfortable at this time . Eyes: Grossly normal lids, irises & conjunctiva . ENT: grossly tongue is normal . Neck: no obvious mass . Cardiovascular: S1 S2 normal no gallop . Respiratory: No rales or rhonchi noted . Abdomen: soft . Skin: no rash seen on limited exam . Musculoskeletal: not rigid . Psychiatric:unable to assess . Neurologic: no seizure no involuntary movements         Lab Data:   Basic Metabolic Panel: Recent Labs  Lab 01/10/20 0709 01/13/20 0642  NA 139 139  K 3.8 4.8  CL 91* 93*  CO2 36* 33*  GLUCOSE 158* 150*  BUN 31* 43*  CREATININE 0.64 0.72  CALCIUM 10.0 9.9  MG 2.1 2.1  PHOS 4.7* 3.7    ABG: Recent Labs  Lab 01/13/20 1350  PHART 7.382  PCO2ART 66.6*  PO2ART 71.0*  HCO3 38.7*  O2SAT 93.9    Liver Function Tests: No results for input(s): AST, ALT, ALKPHOS, BILITOT, PROT, ALBUMIN in the last 168 hours. No results for input(s): LIPASE, AMYLASE in the last 168 hours. No results for input(s): AMMONIA in the last 168 hours.  CBC: Recent Labs  Lab 01/10/20 0709 01/13/20 0642  WBC 6.4 9.8  HGB 10.7* 10.2*  HCT 36.6 35.6*  MCV 96.3 97.5  PLT 201 174    Cardiac Enzymes: No results for input(s): CKTOTAL, CKMB, CKMBINDEX, TROPONINI in the last 168 hours.  BNP (last 3 results) No results  for input(s): BNP in the last 8760 hours.  ProBNP (last 3 results) No results for input(s): PROBNP in the last 8760 hours.  Radiological Exams: DG CHEST PORT 1 VIEW  Result Date: 01/13/2020 CLINICAL DATA:  Pneumonia. Shortness of breath. EXAM: PORTABLE CHEST 1 VIEW COMPARISON:  01/10/2020 FINDINGS: Tracheostomy tube tip at the thoracic inlet. Stable cardiomegaly and mediastinal contours. Aortic atherosclerosis. Unchanged elevation of the hemidiaphragm. Streaky bibasilar opacities are unchanged. No pneumothorax or large pleural effusion. IMPRESSION: Unchanged bibasilar opacities, may be atelectasis, pneumonia or scarring. No new abnormalities. Electronically Signed   By: Keith Rake M.D.   On: 01/13/2020 06:40    Assessment/Plan Active Problems:   Acute on chronic respiratory failure with hypoxia (HCC)   COVID-19 virus infection   Myasthenia gravis ([place])   Multifocal pneumonia   Pneumonia due to COVID-19 virus   Critical illness polyneuropathy ([place])   1. Acute on chronic respiratory failure hypoxia plan is to continue with capping trials as ordered titrate oxygen down as tolerated 2. COVID-19 virus infection clinically improving we will continue with supportive care 3. Myasthenia gravis improved 4. Multifocal pneumonia as above improving there is still some opacities noted 5. Pneumonia due to COVID-19 with residual changes on the chest x-ray will need to follow-up after discharge 6. Critical illness polyneuropathy no change at this time supportive care therapy as  tolerated   I have personally seen and evaluated the patient, evaluated laboratory and imaging results, formulated the assessment and plan and placed orders. The Patient requires high complexity decision making with multiple systems involvement.  Rounds were done with the Respiratory Therapy Director and Staff therapists and discussed with nursing staff also.  Yevonne Pax, MD [facility] Pulmonary Critical Care  Medicine Sleep Medicine

## 2020-01-15 DIAGNOSIS — G6281 Critical illness polyneuropathy: Secondary | ICD-10-CM | POA: Diagnosis not present

## 2020-01-15 DIAGNOSIS — U071 COVID-19: Secondary | ICD-10-CM | POA: Diagnosis not present

## 2020-01-15 DIAGNOSIS — J189 Pneumonia, unspecified organism: Secondary | ICD-10-CM | POA: Diagnosis not present

## 2020-01-15 DIAGNOSIS — J9621 Acute and chronic respiratory failure with hypoxia: Secondary | ICD-10-CM | POA: Diagnosis not present

## 2020-01-15 LAB — NOVEL CORONAVIRUS, NAA (HOSP ORDER, SEND-OUT TO REF LAB; TAT 18-24 HRS): SARS-CoV-2, NAA: NOT DETECTED

## 2020-01-15 NOTE — Progress Notes (Addendum)
Pulmonary Critical Care Medicine [facility] [place]   PULMONARY CRITICAL CARE SERVICE  PROGRESS NOTE  Date of Service: 01/15/2020  Kelly Dawson  [id number]  DOB: 01-29-1951   DOA: 12/10/2019  Referring Physician: Carron Curie, MD  HPI: Kelly Dawson is a 69 y.o. female seen for follow up of Acute on Chronic Respiratory Failure.  Patient remains capped on 2 L nasal cannula satting well plan is for discharge tomorrow to inpatient rehab.  Medications: Reviewed on Rounds  Physical Exam:  Vitals: Pulse 89 respirations 20 BP 164/60 O2 sat 96% temp 98.4  Ventilator Settings 2 L nasal cannula  . General: Comfortable at this time . Eyes: Grossly normal lids, irises & conjunctiva . ENT: grossly tongue is normal . Neck: no obvious mass . Cardiovascular: S1 S2 normal no gallop . Respiratory: No rales or rhonchi noted . Abdomen: soft . Skin: no rash seen on limited exam . Musculoskeletal: not rigid . Psychiatric:unable to assess . Neurologic: no seizure no involuntary movements         Lab Data:   Basic Metabolic Panel: Recent Labs  Lab 01/10/20 0709 01/13/20 0642  NA 139 139  K 3.8 4.8  CL 91* 93*  CO2 36* 33*  GLUCOSE 158* 150*  BUN 31* 43*  CREATININE 0.64 0.72  CALCIUM 10.0 9.9  MG 2.1 2.1  PHOS 4.7* 3.7    ABG: Recent Labs  Lab 01/13/20 1350  PHART 7.382  PCO2ART 66.6*  PO2ART 71.0*  HCO3 38.7*  O2SAT 93.9    Liver Function Tests: No results for input(s): AST, ALT, ALKPHOS, BILITOT, PROT, ALBUMIN in the last 168 hours. No results for input(s): LIPASE, AMYLASE in the last 168 hours. No results for input(s): AMMONIA in the last 168 hours.  CBC: Recent Labs  Lab 01/10/20 0709 01/13/20 0642  WBC 6.4 9.8  HGB 10.7* 10.2*  HCT 36.6 35.6*  MCV 96.3 97.5  PLT 201 174    Cardiac Enzymes: No results for input(s): CKTOTAL, CKMB, CKMBINDEX, TROPONINI in the last 168 hours.  BNP (last 3 results) No results for input(s): BNP in the  last 8760 hours.  ProBNP (last 3 results) No results for input(s): PROBNP in the last 8760 hours.  Radiological Exams: No results found.  Assessment/Plan Active Problems:   Acute on chronic respiratory failure with hypoxia (HCC)   COVID-19 virus infection   Myasthenia gravis (HCC)   Multifocal pneumonia   Pneumonia due to COVID-19 virus   Critical illness polyneuropathy (HCC)   1. Acute on chronic respiratory failure hypoxia plan is to continue with capping trials as ordered titrate oxygen down as tolerated.  Patient is to be discharged to inpatient rehab tomorrow.  Continue supportive measures and pulmonary toilet. 2. COVID-19 virus infection clinically improving we will continue with supportive care 3. Myasthenia gravis improved 4. Multifocal pneumonia as above improving there is still some opacities noted 5. Pneumonia due to COVID-19 with residual changes on the chest x-ray will need to follow-up after discharge 6. Critical illness polyneuropathy no change at this time supportive care therapy as tolerated   I have personally seen and evaluated the patient, evaluated laboratory and imaging results, formulated the assessment and plan and placed orders. The Patient requires high complexity decision making with multiple systems involvement.  Rounds were done with the Respiratory Therapy Director and Staff therapists and discussed with nursing staff also.  Yevonne Pax, MD [facility] Pulmonary Critical Care Medicine Sleep Medicine

## 2020-01-16 DIAGNOSIS — J9621 Acute and chronic respiratory failure with hypoxia: Secondary | ICD-10-CM | POA: Diagnosis not present

## 2020-01-16 DIAGNOSIS — G6281 Critical illness polyneuropathy: Secondary | ICD-10-CM | POA: Diagnosis not present

## 2020-01-16 DIAGNOSIS — J189 Pneumonia, unspecified organism: Secondary | ICD-10-CM | POA: Diagnosis not present

## 2020-01-16 DIAGNOSIS — U071 COVID-19: Secondary | ICD-10-CM | POA: Diagnosis not present

## 2020-01-16 LAB — CBC
HCT: 34.6 % — ABNORMAL LOW (ref 36.0–46.0)
Hemoglobin: 10 g/dL — ABNORMAL LOW (ref 12.0–15.0)
MCH: 28.7 pg (ref 26.0–34.0)
MCHC: 28.9 g/dL — ABNORMAL LOW (ref 30.0–36.0)
MCV: 99.1 fL (ref 80.0–100.0)
Platelets: 180 10*3/uL (ref 150–400)
RBC: 3.49 MIL/uL — ABNORMAL LOW (ref 3.87–5.11)
RDW: 17.8 % — ABNORMAL HIGH (ref 11.5–15.5)
WBC: 5.5 10*3/uL (ref 4.0–10.5)
nRBC: 0 % (ref 0.0–0.2)

## 2020-01-16 LAB — BASIC METABOLIC PANEL
Anion gap: 11 (ref 5–15)
BUN: 52 mg/dL — ABNORMAL HIGH (ref 8–23)
CO2: 38 mmol/L — ABNORMAL HIGH (ref 22–32)
Calcium: 10.4 mg/dL — ABNORMAL HIGH (ref 8.9–10.3)
Chloride: 93 mmol/L — ABNORMAL LOW (ref 98–111)
Creatinine, Ser: 0.77 mg/dL (ref 0.44–1.00)
GFR calc Af Amer: >60 mL/min (ref >60)
GFR calc non Af Amer: >60 mL/min (ref >60)
Glucose, Bld: 161 mg/dL — ABNORMAL HIGH (ref 70–99)
Potassium: 4.5 mmol/L (ref 3.5–5.1)
Sodium: 142 mmol/L (ref 135–145)

## 2020-01-16 LAB — MAGNESIUM: Magnesium: 2.3 mg/dL (ref 1.7–2.4)

## 2020-01-16 LAB — PHOSPHORUS: Phosphorus: 3.7 mg/dL (ref 2.5–4.6)

## 2020-01-16 NOTE — Progress Notes (Signed)
Pulmonary Critical Care Medicine [facility] [place]   PULMONARY CRITICAL CARE SERVICE  PROGRESS NOTE  Date of Service: 01/16/2020  Kelly Dawson  [id number]  DOB: [birth year]   DOA: 12/10/2019  Referring Physician: Carron Curie, MD  HPI: Kelly Dawson is a 69 y.o. female seen for follow up of Acute on Chronic Respiratory Failure.  Patient is currently capping has been on 2 L of O2 good saturations are noted she feels a little bit tired today so we will get a hold off on decannulation.  Also she can potentially be transferred to the rehab facility and be decannulated there if the need arises that we need to wait  Medications: Reviewed on Rounds  Physical Exam:  Vitals: Temperature is 98.4 pulse 80 respiratory rate 16 blood pressure is 143/63 saturations 100%  Ventilator Settings capping off the ventilator on 2 L  . General: Comfortable at this time . Eyes: Grossly normal lids, irises & conjunctiva . ENT: grossly tongue is normal . Neck: no obvious mass . Cardiovascular: S1 S2 normal no gallop . Respiratory: No rhonchi coarse breath sounds . Abdomen: soft . Skin: no rash seen on limited exam . Musculoskeletal: not rigid . Psychiatric:unable to assess . Neurologic: no seizure no involuntary movements         Lab Data:   Basic Metabolic Panel: Recent Labs  Lab 01/10/20 0709 01/13/20 0642 01/16/20 0503  NA 139 139 142  K 3.8 4.8 4.5  CL 91* 93* 93*  CO2 36* 33* 38*  GLUCOSE 158* 150* 161*  BUN 31* 43* 52*  CREATININE 0.64 0.72 0.77  CALCIUM 10.0 9.9 10.4*  MG 2.1 2.1 2.3  PHOS 4.7* 3.7 3.7    ABG: Recent Labs  Lab 01/13/20 1350  PHART 7.382  PCO2ART 66.6*  PO2ART 71.0*  HCO3 38.7*  O2SAT 93.9    Liver Function Tests: No results for input(s): AST, ALT, ALKPHOS, BILITOT, PROT, ALBUMIN in the last 168 hours. No results for input(s): LIPASE, AMYLASE in the last 168 hours. No results for input(s): AMMONIA in the last 168  hours.  CBC: Recent Labs  Lab 01/10/20 0709 01/13/20 0642 01/16/20 0503  WBC 6.4 9.8 5.5  HGB 10.7* 10.2* 10.0*  HCT 36.6 35.6* 34.6*  MCV 96.3 97.5 99.1  PLT 201 174 180    Cardiac Enzymes: No results for input(s): CKTOTAL, CKMB, CKMBINDEX, TROPONINI in the last 168 hours.  BNP (last 3 results) No results for input(s): BNP in the last 8760 hours.  ProBNP (last 3 results) No results for input(s): PROBNP in the last 8760 hours.  Radiological Exams: No results found.  Assessment/Plan Active Problems:   Acute on chronic respiratory failure with hypoxia (HCC)   COVID-19 virus infection   Myasthenia gravis (HCC)   Multifocal pneumonia   Pneumonia due to COVID-19 virus   Critical illness polyneuropathy (HCC)   1. Acute on chronic respiratory failure with hypoxia continue with capping trials hold off on decannulation for now as she is feeling a little weak 2. COVID-19 virus infection resolved 3. Myasthenia gravis continue with her current medications 4. Multifocal pneumonia treated 5. Pneumonia due to COVID-19 resolved 6. Critical illness polyneuropathy slow improvement will need ongoing rehab   I have personally seen and evaluated the patient, evaluated laboratory and imaging results, formulated the assessment and plan and placed orders. The Patient requires high complexity decision making with multiple systems involvement.  Rounds were done with the Respiratory Therapy Director and Staff therapists and discussed  with nursing staff also.  Allyne Gee, MD [facility] Pulmonary Critical Care Medicine Sleep Medicine

## 2021-05-08 IMAGING — DX DG ABDOMEN 1V
1 series · 1 of 1 positions shown · non-contrast
Comparison: No priors.

CLINICAL DATA: 68-year-old female under evaluation for percutaneous
gastrostomy tube placement.

EXAM:
ABDOMEN - 1 VIEW

[abdomen]
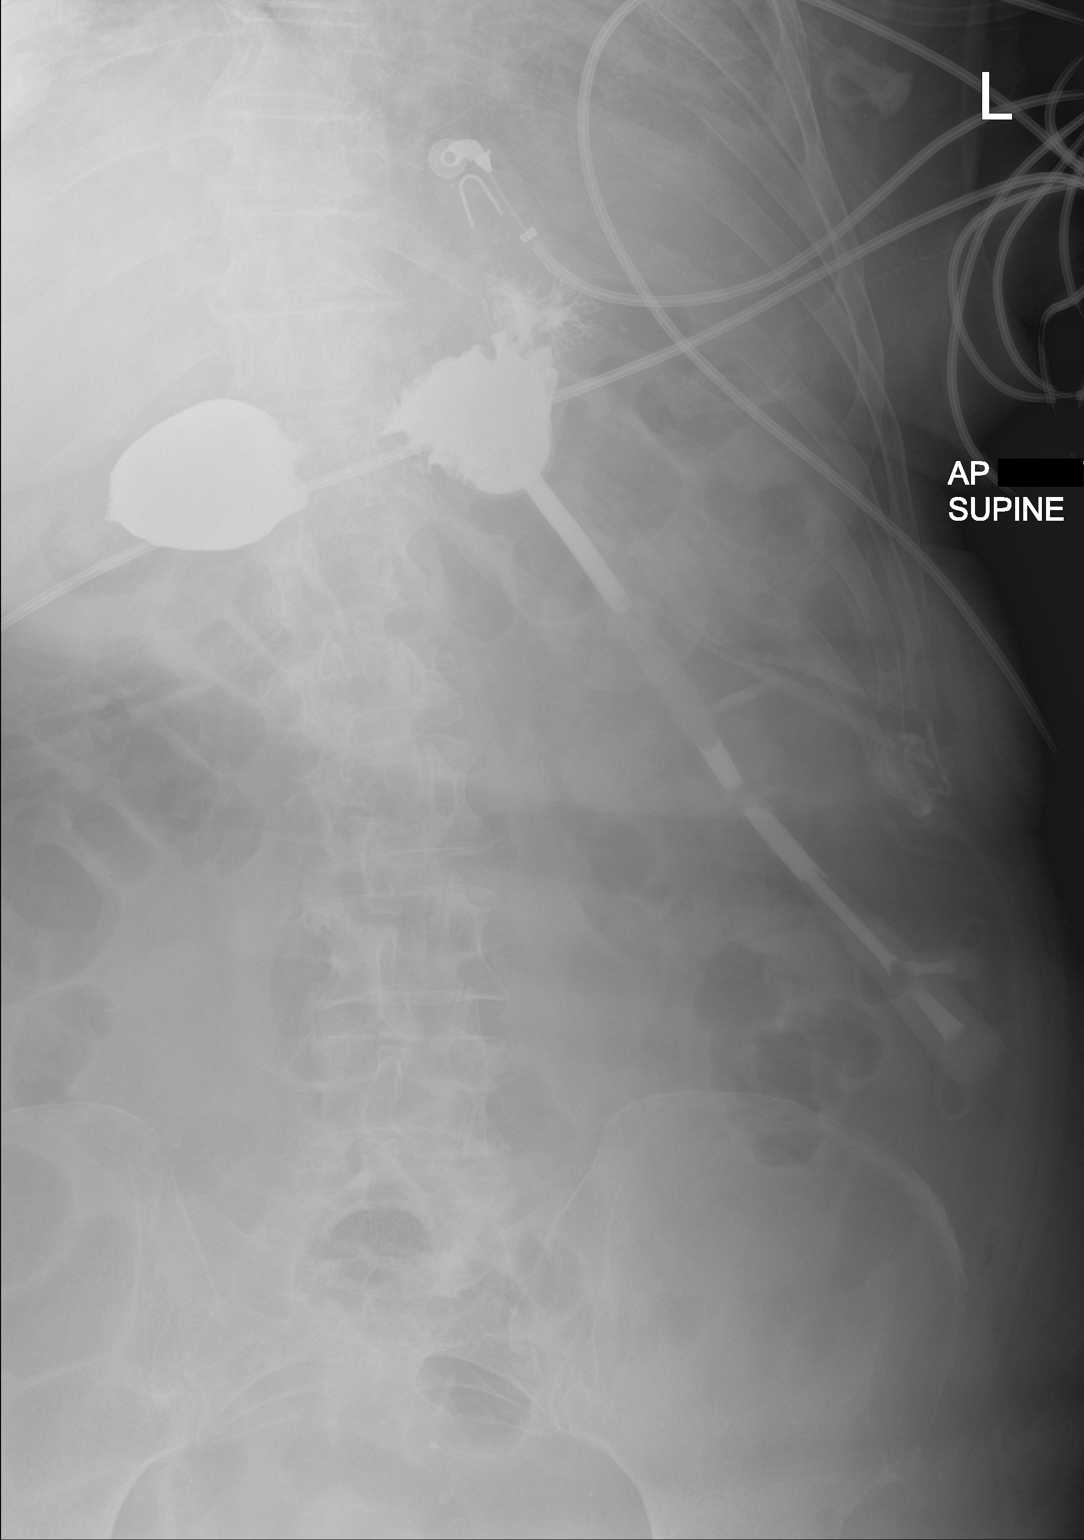

[1 of 1 positions shown; findings below may reference images not displayed]

FINDINGS: Single view of the abdomen after injection of percutaneous
gastrostomy tube demonstrates contrast in the lumen of the stomach.
Nonobstructive bowel gas pattern.
IMPRESSION: 1. Tip of percutaneous gastrostomy tube appears to be within the
lumen of the stomach.

## 2021-05-09 IMAGING — DX DG CHEST 1V PORT
1 series · 1 of 1 positions shown · non-contrast
Comparison: None.

CLINICAL DATA: Encounter for Respiratory failure and pneumonia.

EXAM:
PORTABLE CHEST 1 VIEW

[chest]
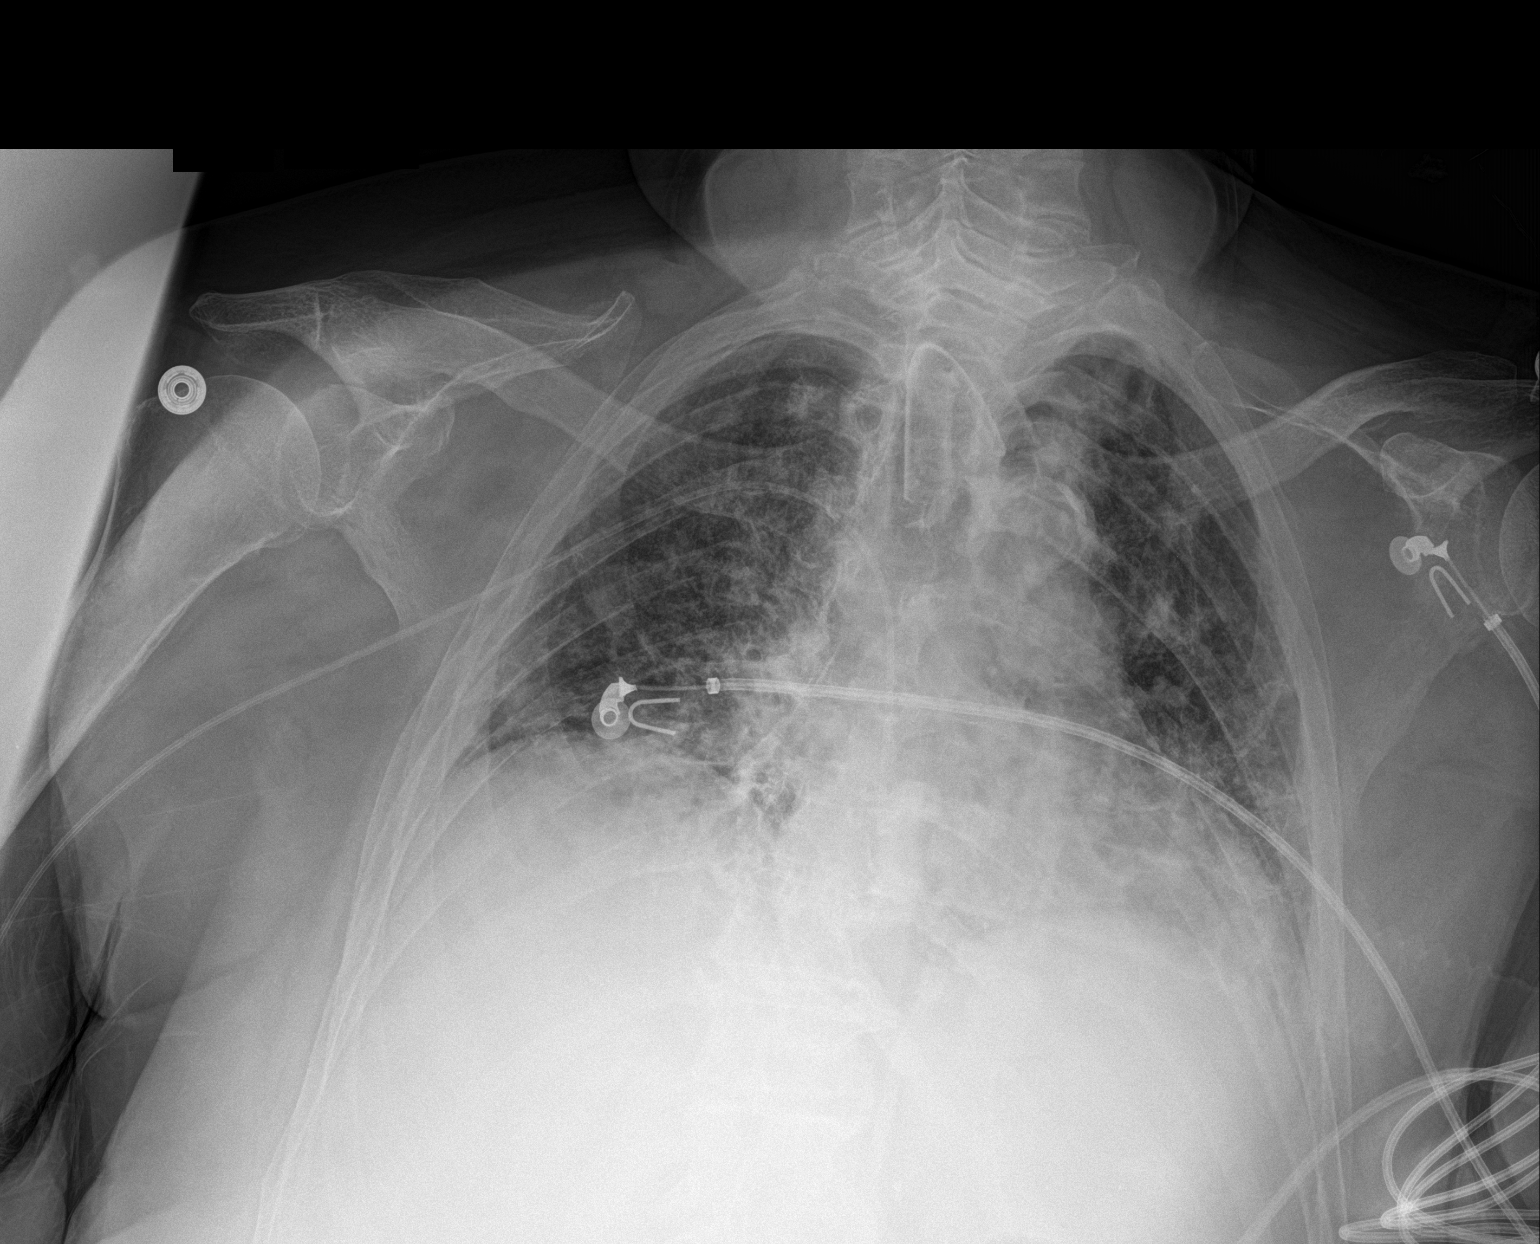

[1 of 1 positions shown; findings below may reference images not displayed]

FINDINGS: Tracheostomy tube good position. Low lung volumes. There is patchy
bilateral nodular airspace densities. RIGHT PICC line tip in the
distal SVC.
IMPRESSION: Patchy bilateral airspace densities concerning for multifocal
pneumonia or edema.

Low lung volumes.

## 2021-05-11 IMAGING — DX DG CHEST 1V PORT
1 series · 1 of 1 positions shown · non-contrast
Comparison: 12/11/2019

CLINICAL DATA: Acute respiratory distress. [id number] viral
pneumonia. On ventilator.

EXAM:
PORTABLE CHEST 1 VIEW

[chest]
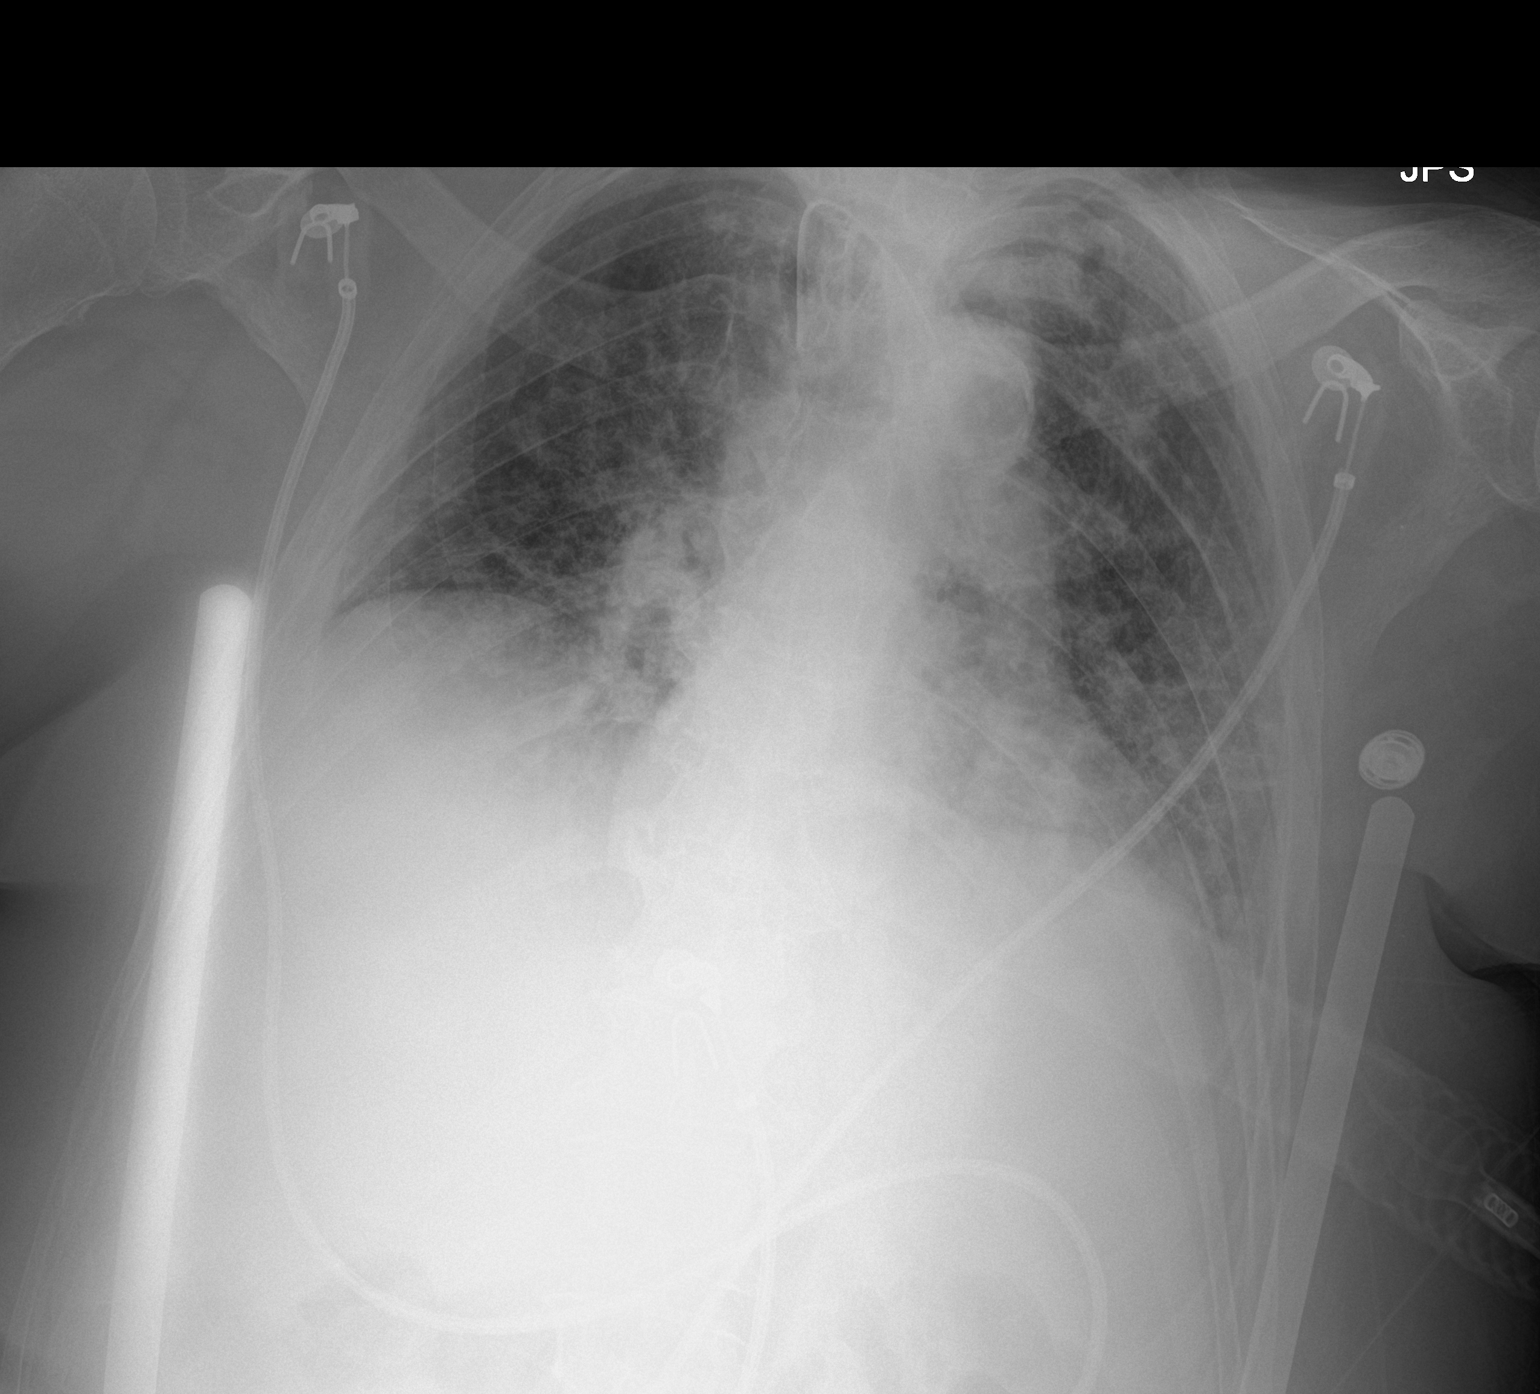

[1 of 1 positions shown; findings below may reference images not displayed]

FINDINGS: Tracheostomy tube remains in appropriate position. Heart size and
low lung volumes are stable. Diffuse interstitial infiltrates show
no significant change. No evidence of focal consolidation or pleural
effusion. Aortic atherosclerosis incidentally noted.
IMPRESSION: Stable low lung volumes and diffuse interstitial infiltrates.

## 2021-05-15 IMAGING — DX DG CHEST 1V PORT
1 series · 1 of 1 positions shown · non-contrast
Comparison: 12/17/2019, 12/15/2019

CLINICAL DATA: 68-year-old female with pneumonia

EXAM:
PORTABLE CHEST 1 VIEW

[chest]
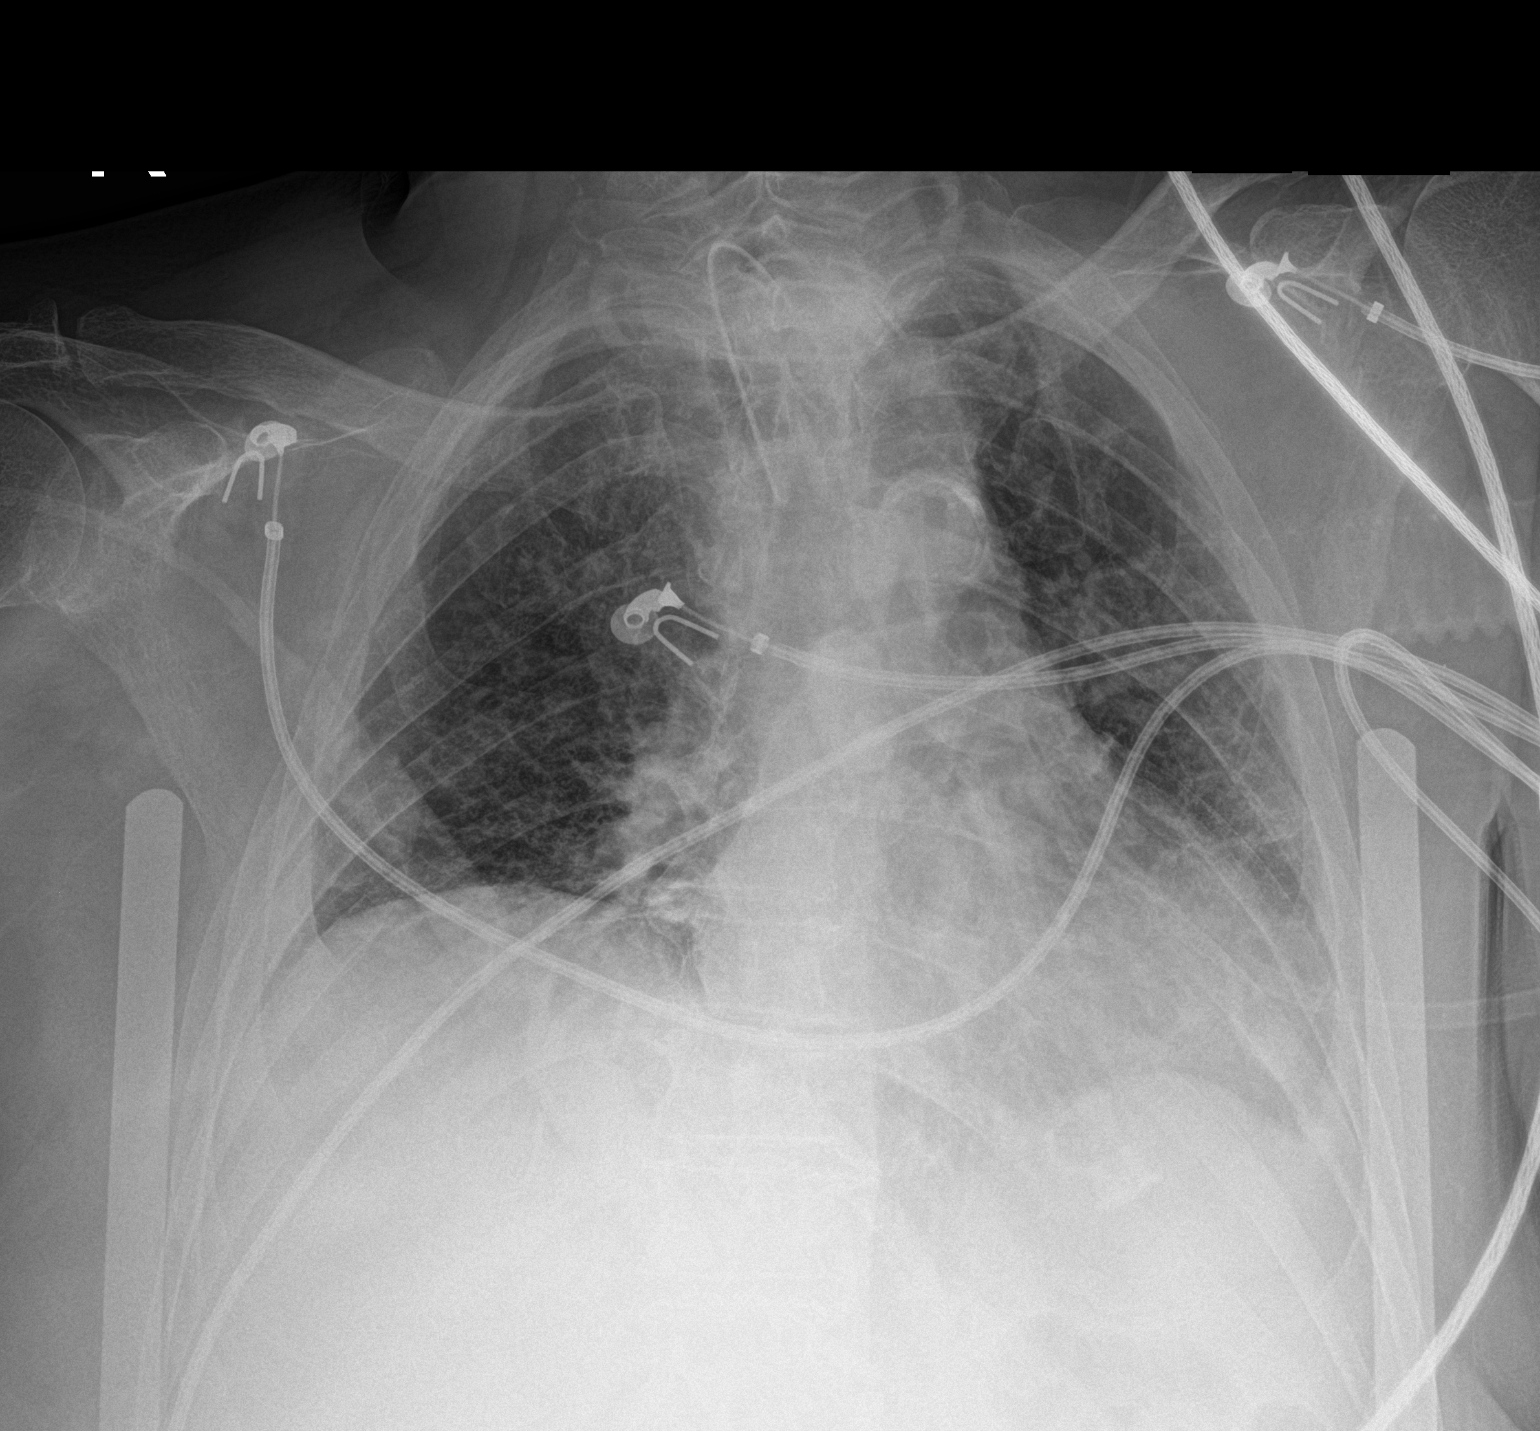

[1 of 1 positions shown; findings below may reference images not displayed]

FINDINGS: Cardiomediastinal silhouette unchanged in size and contour with low
lung volumes.

Similar appearance of coarsened interstitial markings bilaterally
with no new confluent airspace disease pleural effusion or
pneumothorax.

Tracheostomy tube terminates suitably above the carina. Aortic
calcifications
IMPRESSION: Similar appearance of the lungs with low lung volumes and reticular
opacities.

Tracheostomy tube terminates suitably above the carina.
